# Patient Record
Sex: Male | Born: 1960 | Race: White | Hispanic: No | Marital: Married | Smoking: Never smoker
Health system: Southern US, Community
[De-identification: ages and names within clinical notes are randomized; demographics above are authoritative.]

## PROBLEM LIST (undated history)

## (undated) HISTORY — PX: CHOLECYSTECTOMY: SHX55

---

## 2021-01-22 ENCOUNTER — Emergency Department (HOSPITAL_BASED_OUTPATIENT_CLINIC_OR_DEPARTMENT_OTHER): Payer: 59

## 2021-01-22 ENCOUNTER — Observation Stay (HOSPITAL_BASED_OUTPATIENT_CLINIC_OR_DEPARTMENT_OTHER)
Admission: EM | Admit: 2021-01-22 | Discharge: 2021-01-24 | Disposition: A | Discharge status: Home / Self Care | Payer: 59 | Attending: Internal Medicine | Admitting: Internal Medicine

## 2021-01-22 ENCOUNTER — Encounter (HOSPITAL_BASED_OUTPATIENT_CLINIC_OR_DEPARTMENT_OTHER): Payer: Self-pay | Admitting: *Deleted

## 2021-01-22 ENCOUNTER — Other Ambulatory Visit: Payer: Self-pay

## 2021-01-22 DIAGNOSIS — Z20822 Contact with and (suspected) exposure to covid-19: Secondary | ICD-10-CM | POA: Insufficient documentation

## 2021-01-22 DIAGNOSIS — E785 Hyperlipidemia, unspecified: Secondary | ICD-10-CM | POA: Insufficient documentation

## 2021-01-22 DIAGNOSIS — R030 Elevated blood-pressure reading, without diagnosis of hypertension: Secondary | ICD-10-CM | POA: Diagnosis present

## 2021-01-22 DIAGNOSIS — N400 Enlarged prostate without lower urinary tract symptoms: Secondary | ICD-10-CM | POA: Diagnosis present

## 2021-01-22 DIAGNOSIS — R531 Weakness: Secondary | ICD-10-CM

## 2021-01-22 DIAGNOSIS — G459 Transient cerebral ischemic attack, unspecified: Secondary | ICD-10-CM | POA: Diagnosis not present

## 2021-01-22 DIAGNOSIS — N401 Enlarged prostate with lower urinary tract symptoms: Secondary | ICD-10-CM | POA: Diagnosis not present

## 2021-01-22 DIAGNOSIS — Z79899 Other long term (current) drug therapy: Secondary | ICD-10-CM | POA: Insufficient documentation

## 2021-01-22 LAB — URINALYSIS, ROUTINE W REFLEX MICROSCOPIC
Bilirubin Urine: NEGATIVE
Glucose, UA: NEGATIVE mg/dL
Hgb urine dipstick: NEGATIVE
Ketones, ur: NEGATIVE mg/dL
Leukocytes,Ua: NEGATIVE
Nitrite: NEGATIVE
Protein, ur: NEGATIVE mg/dL
Specific Gravity, Urine: 1.025 (ref 1.005–1.030)
pH: 7 (ref 5.0–8.0)

## 2021-01-22 LAB — COMPREHENSIVE METABOLIC PANEL
ALT: 18 U/L (ref 0–44)
AST: 19 U/L (ref 15–41)
Albumin: 3.8 g/dL (ref 3.5–5.0)
Alkaline Phosphatase: 80 U/L (ref 38–126)
Anion gap: 8 (ref 5–15)
BUN: 16 mg/dL (ref 6–20)
CO2: 26 mmol/L (ref 22–32)
Calcium: 8.6 mg/dL — ABNORMAL LOW (ref 8.9–10.3)
Chloride: 103 mmol/L (ref 98–111)
Creatinine, Ser: 1.1 mg/dL (ref 0.61–1.24)
GFR, Estimated: >60 mL/min (ref >60)
Glucose, Bld: 110 mg/dL — ABNORMAL HIGH (ref 70–99)
Potassium: 3.7 mmol/L (ref 3.5–5.1)
Sodium: 137 mmol/L (ref 135–145)
Total Bilirubin: 0.8 mg/dL (ref 0.3–1.2)
Total Protein: 6.8 g/dL (ref 6.5–8.1)

## 2021-01-22 LAB — RESP PANEL BY RT-PCR (FLU A&B, COVID) ARPGX2
Influenza A by PCR: NEGATIVE
Influenza B by PCR: NEGATIVE
SARS Coronavirus 2 by RT PCR: NEGATIVE

## 2021-01-22 LAB — PROTIME-INR
INR: 0.9 (ref 0.8–1.2)
Prothrombin Time: 12.2 seconds (ref 11.4–15.2)

## 2021-01-22 LAB — RAPID URINE DRUG SCREEN, HOSP PERFORMED
Amphetamines: NOT DETECTED
Barbiturates: NOT DETECTED
Benzodiazepines: NOT DETECTED
Cocaine: NOT DETECTED
Opiates: NOT DETECTED
Tetrahydrocannabinol: NOT DETECTED

## 2021-01-22 LAB — DIFFERENTIAL
Abs Immature Granulocytes: 0.04 10*3/uL (ref 0.00–0.07)
Basophils Absolute: 0.1 10*3/uL (ref 0.0–0.1)
Basophils Relative: 1 %
Eosinophils Absolute: 0.4 10*3/uL (ref 0.0–0.5)
Eosinophils Relative: 5 %
Immature Granulocytes: 1 %
Lymphocytes Relative: 36 %
Lymphs Abs: 2.7 10*3/uL (ref 0.7–4.0)
Monocytes Absolute: 0.8 10*3/uL (ref 0.1–1.0)
Monocytes Relative: 10 %
Neutro Abs: 3.6 10*3/uL (ref 1.7–7.7)
Neutrophils Relative %: 47 %

## 2021-01-22 LAB — CBC
HCT: 42.5 % (ref 39.0–52.0)
Hemoglobin: 15.4 g/dL (ref 13.0–17.0)
MCH: 31.1 pg (ref 26.0–34.0)
MCHC: 36.2 g/dL — ABNORMAL HIGH (ref 30.0–36.0)
MCV: 85.9 fL (ref 80.0–100.0)
Platelets: 209 10*3/uL (ref 150–400)
RBC: 4.95 MIL/uL (ref 4.22–5.81)
RDW: 12.3 % (ref 11.5–15.5)
WBC: 7.6 10*3/uL (ref 4.0–10.5)
nRBC: 0 % (ref 0.0–0.2)

## 2021-01-22 LAB — APTT: aPTT: 25 seconds (ref 24–36)

## 2021-01-22 LAB — CBG MONITORING, ED: Glucose-Capillary: 106 mg/dL — ABNORMAL HIGH (ref 70–99)

## 2021-01-22 LAB — ETHANOL: Alcohol, Ethyl (B): <10 mg/dL (ref <10)

## 2021-01-22 MED ORDER — ASPIRIN 81 MG PO CHEW
324.0000 mg | CHEWABLE_TABLET | Freq: Once | ORAL | Status: AC
  Administered 2021-01-22: 324 mg via ORAL
  Filled 2021-01-22: qty 4

## 2021-01-22 MED ORDER — IOHEXOL 350 MG/ML SOLN
80.0000 mL | Freq: Once | INTRAVENOUS | Status: AC | PRN
  Administered 2021-01-22: 80 mL via INTRAVENOUS

## 2021-01-22 MED ORDER — SODIUM CHLORIDE 0.9 % IV BOLUS
1000.0000 mL | Freq: Once | INTRAVENOUS | Status: AC
  Administered 2021-01-22: 1000 mL via INTRAVENOUS

## 2021-01-22 NOTE — Plan of Care (Signed)
[facility] will assume care on arrival to accepting facility. Until arrival, care as per EDP. However, [facility] available 24/7 for questions and assistance. ° °Nursing staff, please page [facility] Admits and Consults ([phone]) as soon as the patient arrives the hospital.   °

## 2021-01-22 NOTE — ED Notes (Signed)
Patient transported to CT 

## 2021-01-22 NOTE — ED Provider Notes (Signed)
[facility] EMERGENCY DEPARTMENT Provider Note   CSN: [id number] Arrival date & time: 01/22/21  1735     History  Chief Complaint  Patient presents with   Extremity Weakness    Seymore Brodowski is a 61 y.o. male.  HPI 61 year old male presents with acute right-sided weakness and tingling.  He states that at around 4:30 PM he was in [employer] and bent over to pick up something and when he stood up he developed lightheadedness, headache, right-sided tingling from his head down to his feet and right-sided weakness.  He is not sure about slurred speech or facial droop as he did not talk to anyone immediately.  For about 15 minutes his right arm was hard to move.  That has now resolved.  Tingling/numbness seems to be gone.  He feels like his right foot might be slightly less coordinated than the left but compared to earlier it is significantly better.  He had a hard time walking because it felt like his right leg would give out.  He states he has a PCP and has been dealing with prostate issues but otherwise denies cholesterol problems or hypertension or diabetes.  Home Medications Prior to Admission medications   Not on File      Allergies    Patient has no known allergies.    Review of Systems   Review of Systems  Neurological:  Positive for weakness, light-headedness, numbness and headaches. Negative for dizziness and speech difficulty.   Physical Exam Updated Vital Signs BP (!) 143/85 (BP Location: Left Arm)    Pulse 70    Temp 98.7 F (37.1 C) (Oral)    Resp 17    Ht 5' 11.5" (1.816 m)    Wt 92.5 kg    SpO2 99%    BMI 28.06 kg/m  Physical Exam Vitals and nursing note reviewed.  Constitutional:      Appearance: He is well-developed.  HENT:     Head: Normocephalic and atraumatic.  Eyes:     Extraocular Movements: Extraocular movements intact.     Pupils: Pupils are equal, round, and reactive to light.  Cardiovascular:     Rate and Rhythm: Normal rate and regular  rhythm.     Heart sounds: Normal heart sounds.  Pulmonary:     Effort: Pulmonary effort is normal.     Breath sounds: Normal breath sounds.  Abdominal:     General: There is no distension.     Palpations: Abdomen is soft.     Tenderness: There is no abdominal tenderness.  Skin:    General: Skin is warm and dry.  Neurological:     Mental Status: He is alert.     Comments: CN 3-12 grossly intact. 5/5 strength in all 4 extremities. Grossly normal sensation. Normal finger to nose. Normal gait    ED Results / Procedures / Treatments   Labs (all labs ordered are listed, but only abnormal results are displayed) Labs Reviewed  CBC - Abnormal; Notable for the following components:      Result Value   MCHC 36.2 (*)    All other components within normal limits  COMPREHENSIVE METABOLIC PANEL - Abnormal; Notable for the following components:   Glucose, Bld 110 (*)    Calcium 8.6 (*)    All other components within normal limits  CBG MONITORING, ED - Abnormal; Notable for the following components:   Glucose-Capillary 106 (*)    All other components within normal limits  RESP PANEL BY  RT-PCR (FLU A&B, COVID) ARPGX2  ETHANOL  PROTIME-INR  APTT  DIFFERENTIAL  RAPID URINE DRUG SCREEN, HOSP PERFORMED  URINALYSIS, ROUTINE W REFLEX MICROSCOPIC    EKG EKG Interpretation  Date/Time:  Thursday January 22 2021 17:49:05 EST Ventricular Rate:  85 PR Interval:  189 QRS Duration: 83 QT Interval:  373 QTC Calculation: 444 R Axis:   30 Text Interpretation: Sinus rhythm Atrial premature complex No old tracing to compare Confirmed by Pricilla Loveless [phone]) on 01/22/2021 7:12:46 PM  Radiology CT ANGIO HEAD NECK W WO CM  Result Date: 01/22/2021 CLINICAL DATA:  Transient ischemic attack, 15 minute episode of right-sided weakness and facial droop, all symptoms have resolved. EXAM: CT ANGIOGRAPHY HEAD AND NECK TECHNIQUE: Multidetector CT imaging of the head and neck was performed using the standard  protocol during bolus administration of intravenous contrast. Multiplanar CT image reconstructions and MIPs were obtained to evaluate the vascular anatomy. Carotid stenosis measurements (when applicable) are obtained utilizing NASCET criteria, using the distal internal carotid diameter as the denominator. RADIATION DOSE REDUCTION: This exam was performed according to the departmental dose-optimization program which includes automated exposure control, adjustment of the mA and/or kV according to patient size and/or use of iterative reconstruction technique. CONTRAST:  67mL OMNIPAQUE IOHEXOL 350 MG/ML SOLN COMPARISON:  No prior CTA, correlation is made with CT head 01/22/2021 FINDINGS: CT HEAD FINDINGS For noncontrast findings, please see same day CT head. CTA NECK FINDINGS Aortic arch: Standard branching. Imaged portion shows no evidence of aneurysm or dissection. No significant stenosis of the major arch vessel origins. Right carotid system: No evidence of dissection, stenosis (50% or greater) or occlusion. Left carotid system: No evidence of dissection, stenosis (50% or greater) or occlusion. Vertebral arteries: Right dominant. No evidence of dissection, stenosis (50% or greater) or occlusion. Skeleton: No acute osseous abnormality. Other neck: Negative. Upper chest: Negative. Review of the MIP images confirms the above findings CTA HEAD FINDINGS Anterior circulation: Both internal carotid arteries are patent to the termini, without stenosis or other abnormality. A1 segments patent. Normal anterior communicating artery. Anterior cerebral arteries are patent to their opacified distal aspects. No M1 stenosis or occlusion. Normal MCA bifurcations. Evaluation of the distal MCA branches is somewhat limited by bolus timing; however within this limitation, distal MCA branches appear perfused and symmetric. Posterior circulation: The left vertebral artery primarily supplies PICA, with a diminutive branch continuing to the  vertebrobasilar junction. The right vertebral artery is patent to the vertebrobasilar junction. Basilar patent to its distal aspect. Superior cerebellar arteries patent bilaterally. The left P1 originates from the basilar artery. Fetal origin of the right PCA. PCAs perfused to their distal aspects without stenosis. The left posterior communicating artery is not visualized. Venous sinuses: As permitted by contrast timing, patent. Anatomic variants: None significant Review of the MIP images confirms the above findings IMPRESSION: 1.  No intracranial large vessel occlusion or significant stenosis. 2.  No hemodynamically significant stenosis in the neck. Electronically Signed   By: Wiliam Ke M.D.   On: 01/22/2021 19:50   CT HEAD WO CONTRAST  Result Date: 01/22/2021 CLINICAL DATA:  Transient neuro deficit. EXAM: CT HEAD WITHOUT CONTRAST TECHNIQUE: Contiguous axial images were obtained from the base of the skull through the vertex without intravenous contrast. RADIATION DOSE REDUCTION: This exam was performed according to the departmental dose-optimization program which includes automated exposure control, adjustment of the mA and/or kV according to patient size and/or use of iterative reconstruction technique. COMPARISON:  None. FINDINGS: Brain: The  ventricles are normal in size and configuration. No extra-axial fluid collections are identified. The gray-white differentiation is maintained. No CT findings for acute hemispheric infarction or intracranial hemorrhage. No mass lesions. The brainstem and cerebellum are normal. Vascular: No hyperdense vessels or obvious aneurysm. Skull: No acute skull fracture.  No bone lesion. Sinuses/Orbits: The paranasal sinuses and mastoid air cells are clear. The globes are intact. Other: No scalp lesions, laceration or hematoma. IMPRESSION: No acute intracranial findings or mass lesions. Electronically Signed   By: Rudie MeyerP.  Gallerani M.D.   On: 01/22/2021 18:38     Procedures Procedures    Medications Ordered in ED Medications  aspirin chewable tablet 324 mg (324 mg Oral Given 01/22/21 1933)  sodium chloride 0.9 % bolus 1,000 mL (1,000 mLs Intravenous New Bag/Given 01/22/21 1933)  iohexol (OMNIPAQUE) 350 MG/ML injection 80 mL (80 mLs Intravenous Contrast Given 01/22/21 1916)    ED Course/ Medical Decision Making/ A&P Clinical Course as of 01/22/21 2014  Thu Jan 22, 2021  1754 Patient feels like his symptoms have essentially resolved.  There might be a small amount of residual right leg discoordination but he states its basically gone.  He was able to get up and walk.  He can stand on just his right leg when asked.  While he is within the code stroke window, his symptoms have so rapidly improved and are essentially gone but I do not think he needs thrombolytics and we will not call code stroke. [SG]    Clinical Course User Index [SG] Pricilla LovelessGoldston, Kaci Freel, MD                            Patient presents with an acute TIA.  His symptoms have now completely resolved.  Work-up included a head CT, which I personally reviewed and interpreted and there is no acute ischemia or bleeding.  Patient also has had lab work that I have reviewed and interpreted and shows no leukocytosis, anemia, or significant electrolyte disturbance to cause his symptoms.  He has a mild hypocalcemia that I think is unlikely to contribute.  ECG was obtained and has been personally interpreted and shows no obvious arrhythmia or ischemia.  I discussed his case with neurology on-call, Dr. Selina CooleyStack, who recommends admission for TIA work-up.  Asks for CTA of head and neck, which is negative.  We will give 325 mg aspirin per their recommendations.  Neurology will follow.  Thus [facility]  I discussed this case with the hospitalist, Dr. Loney Lohathore, and she will admit.  Chart review does not show much in the way of significant past medical history or relevant visits  compared to today.  I have updated patient and son at the bedside as well as the wife over the phone.        Final Clinical Impression(s) / ED Diagnoses Final diagnoses:  TIA (transient ischemic attack)    Rx / DC Orders ED Discharge Orders     None         Pricilla LovelessGoldston, Verlene Glantz, MD 01/22/21 2017

## 2021-01-22 NOTE — ED Triage Notes (Signed)
An hour ago he bent over. When he stood he lost feeling in the right side of his body. Facial droop.

## 2021-01-23 DIAGNOSIS — N401 Enlarged prostate with lower urinary tract symptoms: Secondary | ICD-10-CM | POA: Diagnosis not present

## 2021-01-23 DIAGNOSIS — R030 Elevated blood-pressure reading, without diagnosis of hypertension: Secondary | ICD-10-CM | POA: Diagnosis not present

## 2021-01-23 DIAGNOSIS — Z79899 Other long term (current) drug therapy: Secondary | ICD-10-CM | POA: Diagnosis not present

## 2021-01-23 DIAGNOSIS — G459 Transient cerebral ischemic attack, unspecified: Secondary | ICD-10-CM | POA: Diagnosis not present

## 2021-01-23 DIAGNOSIS — N400 Enlarged prostate without lower urinary tract symptoms: Secondary | ICD-10-CM | POA: Diagnosis present

## 2021-01-23 DIAGNOSIS — R531 Weakness: Secondary | ICD-10-CM

## 2021-01-23 DIAGNOSIS — E785 Hyperlipidemia, unspecified: Secondary | ICD-10-CM | POA: Diagnosis not present

## 2021-01-23 DIAGNOSIS — Z20822 Contact with and (suspected) exposure to covid-19: Secondary | ICD-10-CM | POA: Diagnosis not present

## 2021-01-23 LAB — HIV ANTIBODY (ROUTINE TESTING W REFLEX): HIV Screen 4th Generation wRfx: NONREACTIVE

## 2021-01-23 LAB — LIPID PANEL
Cholesterol: 139 mg/dL (ref 0–200)
HDL: 48 mg/dL (ref >40)
LDL Cholesterol: 82 mg/dL (ref 0–99)
Total CHOL/HDL Ratio: 2.9 RATIO
Triglycerides: 44 mg/dL (ref <150)
VLDL: 9 mg/dL (ref 0–40)

## 2021-01-23 MED ORDER — PANTOPRAZOLE SODIUM 40 MG PO TBEC
40.0000 mg | DELAYED_RELEASE_TABLET | Freq: Every day | ORAL | Status: DC
  Administered 2021-01-23 – 2021-01-24 (×2): 40 mg via ORAL
  Filled 2021-01-23 (×2): qty 1

## 2021-01-23 MED ORDER — ACETAMINOPHEN 325 MG PO TABS
650.0000 mg | ORAL_TABLET | ORAL | Status: DC | PRN
  Administered 2021-01-24: 650 mg via ORAL
  Filled 2021-01-23: qty 2

## 2021-01-23 MED ORDER — ASPIRIN EC 81 MG PO TBEC
81.0000 mg | DELAYED_RELEASE_TABLET | Freq: Every day | ORAL | Status: DC
  Administered 2021-01-23 – 2021-01-24 (×2): 81 mg via ORAL
  Filled 2021-01-23 (×2): qty 1

## 2021-01-23 MED ORDER — ENOXAPARIN SODIUM 40 MG/0.4ML IJ SOSY
40.0000 mg | PREFILLED_SYRINGE | INTRAMUSCULAR | Status: DC
  Administered 2021-01-23: 40 mg via SUBCUTANEOUS
  Filled 2021-01-23: qty 0.4

## 2021-01-23 MED ORDER — TAMSULOSIN HCL 0.4 MG PO CAPS
0.4000 mg | ORAL_CAPSULE | Freq: Every day | ORAL | Status: DC
  Administered 2021-01-23: 0.4 mg via ORAL
  Filled 2021-01-23: qty 1

## 2021-01-23 MED ORDER — STROKE: EARLY STAGES OF RECOVERY BOOK
Freq: Once | Status: AC
  Filled 2021-01-23: qty 1

## 2021-01-23 MED ORDER — SENNOSIDES-DOCUSATE SODIUM 8.6-50 MG PO TABS: 1.0000 | ORAL_TABLET | Freq: Every evening | ORAL | Status: DC | PRN

## 2021-01-23 MED ORDER — ACETAMINOPHEN 650 MG RE SUPP: 650.0000 mg | RECTAL | Status: DC | PRN

## 2021-01-23 MED ORDER — ACETAMINOPHEN 160 MG/5ML PO SOLN: 650.0000 mg | ORAL | Status: DC | PRN

## 2021-01-23 MED ORDER — OMEPRAZOLE MAGNESIUM 20 MG PO TBEC: 20.0000 mg | DELAYED_RELEASE_TABLET | Freq: Every day | ORAL | Status: DC

## 2021-01-23 NOTE — ED Notes (Signed)
Phone Handoff Report provided to Sarah-RN at [facility] [address]

## 2021-01-23 NOTE — ED Notes (Signed)
Resting quietly, voices no complaints, ambulates self to restroom. Cont to await for room assignment.

## 2021-01-23 NOTE — Assessment & Plan Note (Signed)
Continue flomax  

## 2021-01-23 NOTE — ED Notes (Signed)
Phone Handoff Report provided to [employer] Jerilynn Som)

## 2021-01-23 NOTE — Evaluation (Signed)
Physical Therapy Evaluation & Discharge Patient Details Name: Austin Harrison MRN: [id number] DOB: September 04, 1960 Today's Date: 01/23/2021  History of Present Illness  61 y/o male presented to [facility] ED on 01/22/21 for acute R sided weakness and tingling. CT head negative. MRI pending. No significant PMH.  Clinical Impression  Patient admitted with above. Patient functioning at independent level. Obtained orthostatics due to patient having complaints of dizziness/headache when getting out of bed, however vitals were stable. Patient scored 24/24 on DGI which is indicative of low fall risk. No further skilled PT needs identified. No PT follow up recommended at this time. PT will sign off.        Recommendations for follow up therapy are one component of a multi-disciplinary discharge planning process, led by the attending physician.  Recommendations may be updated based on patient status, additional functional criteria and insurance authorization.  Follow Up Recommendations No PT follow up    Assistance Recommended at Discharge None  Patient can return home with the following       Equipment Recommendations None recommended by PT  Recommendations for Other Services       Functional Status Assessment Patient has not had a recent decline in their functional status     Precautions / Restrictions Precautions Precautions: None Restrictions Weight Bearing Restrictions: No      Mobility  Bed Mobility Overal bed mobility: Independent                  Transfers Overall transfer level: Independent                      Ambulation/Gait Ambulation/Gait assistance: Independent Gait Distance (Feet): 250 Feet Assistive device: None Gait Pattern/deviations: WFL(Within Functional Limits)   Gait velocity interpretation: >4.37 ft/sec, indicative of normal walking speed      Stairs Stairs: Yes Stairs assistance: Independent Stair Management: No rails, Alternating  pattern Number of Stairs: 2    Wheelchair Mobility    Modified Rankin (Stroke Patients Only)       Balance Overall balance assessment: Independent                               Standardized Balance Assessment Standardized Balance Assessment : Dynamic Gait Index   Dynamic Gait Index Level Surface: Normal Change in Gait Speed: Normal Gait with Horizontal Head Turns: Normal Gait with Vertical Head Turns: Normal Gait and Pivot Turn: Normal Step Over Obstacle: Normal Step Around Obstacles: Normal Steps: Normal Total Score: 24       Pertinent Vitals/Pain Pain Assessment Pain Assessment: No/denies pain    Home Living Family/patient expects to be discharged to:: Private residence Living Arrangements: Spouse/significant other;Children (has 44 year old and 10.34 year old living at home. Has 53 y/o son) Available Help at Discharge: Family Type of Home: House Home Access: Level entry       Home Layout: One level Home Equipment: None      Prior Function Prior Level of Function : Independent/Modified Independent;Working/employed;Driving                     Hand Dominance        Extremity/Trunk Assessment   Upper Extremity Assessment Upper Extremity Assessment: Overall WFL for tasks assessed    Lower Extremity Assessment Lower Extremity Assessment: Overall WFL for tasks assessed    Cervical / Trunk Assessment Cervical / Trunk Assessment: Normal  Communication  Communication: No difficulties  Cognition Arousal/Alertness: Awake/alert Behavior During Therapy: WFL for tasks assessed/performed Overall Cognitive Status: Within Functional Limits for tasks assessed                                          General Comments      Exercises     Assessment/Plan    PT Assessment Patient does not need any further PT services  PT Problem List         PT Treatment Interventions      PT Goals (Current goals can be found in  the Care Plan section)  Acute Rehab PT Goals Patient Stated Goal: to go home PT Goal Formulation: All assessment and education complete, DC therapy    Frequency       Co-evaluation               AM-PAC PT "6 Clicks" Mobility  Outcome Measure Help needed turning from your back to your side while in a flat bed without using bedrails?: None Help needed moving from lying on your back to sitting on the side of a flat bed without using bedrails?: None Help needed moving to and from a bed to a chair (including a wheelchair)?: None Help needed standing up from a chair using your arms (e.g., wheelchair or bedside chair)?: None Help needed to walk in hospital room?: None Help needed climbing 3-5 steps with a railing? : None 6 Click Score: 24    End of Session   Activity Tolerance: Patient tolerated treatment well Patient left: in bed;with call bell/phone within reach Nurse Communication: Mobility status PT Visit Diagnosis: Muscle weakness (generalized) (M62.81)    Time: 9371-6967 PT Time Calculation (min) (ACUTE ONLY): 20 min   Charges:   PT Evaluation $PT Eval Low Complexity: 1 Low          Aadhav Uhlig A. Dan Humphreys PT, DPT Acute Rehabilitation Services Pager [phone] Office [phone]   Viviann Spare 01/23/2021, 5:20 PM

## 2021-01-23 NOTE — Assessment & Plan Note (Addendum)
61 year old male with sudden onset right sided weakness and tingling. Risks factors for stroke include age, sex, possible HTN.  -place in observation on telemetry for TIA/stroke work-up. Symptoms have resolved.  -Neurochecks per protocol -Neurology consulted -MRI brain without contrast ordered -echo -lipid/a1c -started on ASA daily, plavix per neurology  -Permissive hypertension first 24 hours <220/110 -PT/OT, speech wnl with no cognitive deficits

## 2021-01-23 NOTE — Assessment & Plan Note (Signed)
He states he has no history of elevated blood pressure. Has periods of what sound like orthostasis where he gets dizzy after bending over Allow for permissive HTN for now in setting of possible stroke Continue to monitor

## 2021-01-23 NOTE — Plan of Care (Signed)

## 2021-01-23 NOTE — Progress Notes (Signed)
°  Transition of Care [facility]) Screening Note   Patient Details  Name: Austin Harrison Date of Birth: Mar 22, 1960   Transition of [facility]) CM/SW Contact:    Pollie Friar, RN Phone Number: 01/23/2021, 3:14 PM    Transition of Care Department [facility]) has reviewed patient. We will continue to monitor patient advancement through interdisciplinary progression rounds. If new patient transition needs arise, please place a TOC consult.

## 2021-01-23 NOTE — H&P (Signed)
History and Physical    Austin Harrison ZHG:[id number] DOB: 07-13-1960 DOA: 01/22/2021  PCP: Doreen Salvage, PA-C Consultants:  urology: unsure of name  Patient coming from:  [facility]. Lives at home with his wife   Chief Complaint: right sided weakness, warmth and tingling  HPI: Austin Harrison is a 61 y.o. male with medical history significant of BPH who presented to ED for right sided weakness, tingling and warmth. LKW on 01/22/21 at around 4:00pm. Around 4:30 pm he was  at [employer] and he bent down to pick something up and as he stood up he had warmth and tingling that started on the right side of his head and went down the entire right side of his body. He has subsequent right sided weakness and felt like he would fall if he tried to walk. He also had tingling and had a feeling like his right arm was just hanging from his body. Tingling lasted for 5 minutes. Weakness lasted for about 1 hour and 15 minutes. He was also told by edp that his right sided of his face may have had a slight droop. Symptoms have resolved.   Risk factors for stroke: age, sex. He does not smoke. No family hx of strokes. He does not have known diabetes and has not been told he has HTN or HLD.   He overall has felt okay. He has had no fever/chills, does have a slight headache, no vision changes, no chest pain or palpitations, no shortness or cough, no abdominal pain, N/V/D, no dysuria or leg swelling.    ED Course: vitals: afebrile, bp: 166/91, HR: 79, RR: 20, oxygen: 100% RA Pertinent labs: none CTH: no acute findings CTA head/neck: no acute findings In ED: given ASA, 1L bolus and neurology was consulted who recommended transfer to [facility] for Tia/stroke w/u. [facility] was asked to admit.    Review of Systems: As per HPI; otherwise review of systems reviewed and negative.   Ambulatory Status:  Ambulates without assistance   History reviewed. No pertinent past medical history.  Past Surgical History:  Procedure Laterality Date    CHOLECYSTECTOMY      Social History   Socioeconomic History   Marital status: Married    Spouse name: Not on file   Number of children: Not on file   Years of education: Not on file   Highest education level: Not on file  Occupational History   Not on file  Tobacco Use   Smoking status: Never   Smokeless tobacco: Never  Vaping Use   Vaping Use: Never used  Substance and Sexual Activity   Alcohol use: Never   Drug use: Never   Sexual activity: Not on file  Other Topics Concern   Not on file  Social History Narrative   Not on file   Social Determinants of Health   Financial Resource Strain: Not on file  Food Insecurity: Not on file  Transportation Needs: Not on file  Physical Activity: Not on file  Stress: Not on file  Social Connections: Not on file  Intimate Partner Violence: Not on file    Allergies  Allergen Reactions   Codeine Nausea And Vomiting         No family history on file.  Prior to Admission medications   Medication Sig Start Date End Date Taking? Authorizing Provider  omeprazole (PRILOSEC OTC) 20 MG tablet Take 20 mg by mouth daily.   Yes [provider]  tamsulosin (FLOMAX) 0.4 MG CAPS capsule Take 0.4  mg by mouth at bedtime. 10/28/20  Yes [provider]    Physical Exam: Vitals:   01/23/21 1100 01/23/21 1216 01/23/21 1219 01/23/21 1550  BP: (!) 141/87 (!) 158/92 (!) 168/92 129/78  Pulse: 65 76 72 76  Resp: 19 18  18   Temp:  98 F (36.7 C)  98.1 F (36.7 C)  TempSrc:  Oral  Oral  SpO2: 96% 99%  98%  Weight:      Height:         General:  Appears calm and comfortable and is in NAD Eyes:  PERRL, EOMI, normal lids, iris ENT:  grossly normal hearing, lips & tongue, mmm; appropriate dentition Neck:  no LAD, masses or thyromegaly; no carotid bruits Cardiovascular:  RRR, no m/r/g. No LE edema.  Respiratory:   CTA bilaterally with no wheezes/rales/rhonchi.  Normal respiratory effort. Abdomen:  soft, NT, ND,  NABS Back:   normal alignment, no CVAT Skin:  no rash or induration seen on limited exam Musculoskeletal:  grossly normal tone BUE/BLE, good ROM, no bony abnormality Lower extremity:  No LE edema.  Limited foot exam with no ulcerations.  2+ distal pulses. Psychiatric:  grossly normal mood and affect, speech fluent and appropriate, AOx3 Neurologic:  CN 2-12 grossly intact, moves all extremities in coordinated fashion, sensation intact. No pronator drift, HTK intact bilaterally, FTK intact bilaterally, DTR 2+, gait deferred.     Radiological Exams on Admission: Independently reviewed - see discussion in A/P where applicable  CT ANGIO HEAD NECK W WO CM  Result Date: 01/22/2021 CLINICAL DATA:  Transient ischemic attack, 15 minute episode of right-sided weakness and facial droop, all symptoms have resolved. EXAM: CT ANGIOGRAPHY HEAD AND NECK TECHNIQUE: Multidetector CT imaging of the head and neck was performed using the standard protocol during bolus administration of intravenous contrast. Multiplanar CT image reconstructions and MIPs were obtained to evaluate the vascular anatomy. Carotid stenosis measurements (when applicable) are obtained utilizing NASCET criteria, using the distal internal carotid diameter as the denominator. RADIATION DOSE REDUCTION: This exam was performed according to the departmental dose-optimization program which includes automated exposure control, adjustment of the mA and/or kV according to patient size and/or use of iterative reconstruction technique. CONTRAST:  30mL OMNIPAQUE IOHEXOL 350 MG/ML SOLN COMPARISON:  No prior CTA, correlation is made with CT head 01/22/2021 FINDINGS: CT HEAD FINDINGS For noncontrast findings, please see same day CT head. CTA NECK FINDINGS Aortic arch: Standard branching. Imaged portion shows no evidence of aneurysm or dissection. No significant stenosis of the major arch vessel origins. Right carotid system: No evidence of dissection, stenosis  (50% or greater) or occlusion. Left carotid system: No evidence of dissection, stenosis (50% or greater) or occlusion. Vertebral arteries: Right dominant. No evidence of dissection, stenosis (50% or greater) or occlusion. Skeleton: No acute osseous abnormality. Other neck: Negative. Upper chest: Negative. Review of the MIP images confirms the above findings CTA HEAD FINDINGS Anterior circulation: Both internal carotid arteries are patent to the termini, without stenosis or other abnormality. A1 segments patent. Normal anterior communicating artery. Anterior cerebral arteries are patent to their opacified distal aspects. No M1 stenosis or occlusion. Normal MCA bifurcations. Evaluation of the distal MCA branches is somewhat limited by bolus timing; however within this limitation, distal MCA branches appear perfused and symmetric. Posterior circulation: The left vertebral artery primarily supplies PICA, with a diminutive branch continuing to the vertebrobasilar junction. The right vertebral artery is patent to the vertebrobasilar junction. Basilar patent to its distal aspect. Superior  cerebellar arteries patent bilaterally. The left P1 originates from the basilar artery. Fetal origin of the right PCA. PCAs perfused to their distal aspects without stenosis. The left posterior communicating artery is not visualized. Venous sinuses: As permitted by contrast timing, patent. Anatomic variants: None significant Review of the MIP images confirms the above findings IMPRESSION: 1.  No intracranial large vessel occlusion or significant stenosis. 2.  No hemodynamically significant stenosis in the neck. Electronically Signed   By: Merilyn Baba M.D.   On: 01/22/2021 19:50   CT HEAD WO CONTRAST  Result Date: 01/22/2021 CLINICAL DATA:  Transient neuro deficit. EXAM: CT HEAD WITHOUT CONTRAST TECHNIQUE: Contiguous axial images were obtained from the base of the skull through the vertex without intravenous contrast. RADIATION DOSE  REDUCTION: This exam was performed according to the departmental dose-optimization program which includes automated exposure control, adjustment of the mA and/or kV according to patient size and/or use of iterative reconstruction technique. COMPARISON:  None. FINDINGS: Brain: The ventricles are normal in size and configuration. No extra-axial fluid collections are identified. The gray-white differentiation is maintained. No CT findings for acute hemispheric infarction or intracranial hemorrhage. No mass lesions. The brainstem and cerebellum are normal. Vascular: No hyperdense vessels or obvious aneurysm. Skull: No acute skull fracture.  No bone lesion. Sinuses/Orbits: The paranasal sinuses and mastoid air cells are clear. The globes are intact. Other: No scalp lesions, laceration or hematoma. IMPRESSION: No acute intracranial findings or mass lesions. Electronically Signed   By: Marijo Sanes M.D.   On: 01/22/2021 18:38    EKG: Independently reviewed.  NSR with rate 85; nonspecific ST changes with no evidence of acute ischemia   Labs on Admission: I have personally reviewed the available labs and imaging studies at the time of the admission.  Pertinent labs:  None     Assessment/Plan * Acute right-sided weakness 61 year old male with sudden onset right sided weakness and tingling. Risks factors for stroke include age, sex, possible HTN.  -place in observation on telemetry for TIA/stroke work-up. Symptoms have resolved.  -Neurochecks per protocol -Neurology consulted -MRI brain without contrast ordered -echo -lipid/a1c -started on ASA daily, plavix per neurology  -Permissive hypertension first 24 hours <220/110 -PT/OT, speech wnl with no cognitive deficits   Elevated blood-pressure reading without diagnosis of hypertension- (present on admission) He states he has no history of elevated blood pressure. Has periods of what sound like orthostasis where he gets dizzy after bending over Allow  for permissive HTN for now in setting of possible stroke Continue to monitor   BPH (benign prostatic hyperplasia)- (present on admission) Continue flomax      Body mass index is 28.06 kg/m.   Level of care: Telemetry Medical DVT prophylaxis:  Lovenox  Code Status:  Full - confirmed with patient Family Communication: None present Disposition Plan:  The patient is from: home  Anticipated d/c is to: home   Patient placed in observation as anticipate less than 2 midnight stay. Requires hospitalization for stroke/tia work up, frequent neurological checks, assessment and MDM with specialists.    Patient is currently: stable Consults called: neurology: Dr. Lorrin Goodell   Admission status:  observation    Orma Flaming MD [facility]   How to contact the [facility] Attending or Consulting provider [place] or covering provider during after hours [place], for this patient?  Check the care team in [facility] and look for a) attending/consulting [facility] provider listed and b) the [facility] team listed Log into [email or web address]  and use [place] universal password to access. If you do not have the password, please contact the hospital operator. Locate the [facility] provider you are looking for under [facility] and page to a number that you can be directly reached. If you still have difficulty reaching the provider, please page the [facility] (Director on Call) for the Hospitalists listed on amion for assistance.   01/23/2021, 7:23 PM

## 2021-01-24 ENCOUNTER — Observation Stay (HOSPITAL_BASED_OUTPATIENT_CLINIC_OR_DEPARTMENT_OTHER): Payer: 59

## 2021-01-24 ENCOUNTER — Observation Stay (HOSPITAL_COMMUNITY): Payer: 59

## 2021-01-24 DIAGNOSIS — G459 Transient cerebral ischemic attack, unspecified: Secondary | ICD-10-CM

## 2021-01-24 DIAGNOSIS — E7800 Pure hypercholesterolemia, unspecified: Secondary | ICD-10-CM

## 2021-01-24 DIAGNOSIS — R531 Weakness: Secondary | ICD-10-CM | POA: Diagnosis not present

## 2021-01-24 LAB — ECHOCARDIOGRAM COMPLETE
AV Mean grad: 6 mmHg
AV Peak grad: 11.2 mmHg
Ao pk vel: 1.67 m/s
Area-P 1/2: 3.08 cm2
Calc EF: 58.1 %
Height: 71.5 in
S' Lateral: 2.6 cm
Single Plane A2C EF: 60.8 %
Single Plane A4C EF: 55.5 %
Weight: 3264 oz

## 2021-01-24 LAB — HEMOGLOBIN A1C
Hgb A1c MFr Bld: 5.6 % (ref 4.8–5.6)
Mean Plasma Glucose: 114 mg/dL

## 2021-01-24 MED ORDER — CLOPIDOGREL BISULFATE 75 MG PO TABS: 75.0000 mg | ORAL_TABLET | Freq: Every day | ORAL | 0 refills | Status: AC

## 2021-01-24 MED ORDER — ATORVASTATIN CALCIUM 40 MG PO TABS: 40.0000 mg | ORAL_TABLET | Freq: Every day | ORAL | 2 refills | Status: AC

## 2021-01-24 MED ORDER — ASPIRIN 81 MG PO TBEC: 81.0000 mg | DELAYED_RELEASE_TABLET | Freq: Every day | ORAL | 11 refills | Status: AC

## 2021-01-24 MED ORDER — ATORVASTATIN CALCIUM 40 MG PO TABS
40.0000 mg | ORAL_TABLET | Freq: Every day | ORAL | Status: DC
  Administered 2021-01-24: 40 mg via ORAL
  Filled 2021-01-24: qty 1

## 2021-01-24 MED ORDER — CLOPIDOGREL BISULFATE 75 MG PO TABS: 75.0000 mg | ORAL_TABLET | Freq: Every day | ORAL | Status: DC

## 2021-01-24 MED ORDER — CLOPIDOGREL BISULFATE 75 MG PO TABS
300.0000 mg | ORAL_TABLET | Freq: Once | ORAL | Status: AC
  Administered 2021-01-24: 300 mg via ORAL
  Filled 2021-01-24: qty 4

## 2021-01-24 NOTE — Consult Note (Signed)
Neurology Consultation Reason for Consult: TIA Referring Physician: Eliberto Ivory, a  CC: Right-sided paresthesia and weakness  History is obtained from: Patient  HPI: Austin Harrison is a 61 y.o. male who was in his normal state of health until around 4 PM on 1/19.  He states that he had numbness that spread down the entire right side of his body.  He noticed that his right arm was limp and not moving correctly when he tried to move it.  He was not able to walk on that leg.  The weakness then gradually improved over the course of an hour or two.  He denies any visual change.  He denies any previous similar symptoms.   LKW: 4 PM 1/19 tpa given?: no, resolution of symptoms   ROS: A 14 point ROS was performed and is negative except as noted in the HPI.    History reviewed. No pertinent past medical history.   No family history on file.   Social History:  reports that he has never smoked. He has never used smokeless tobacco. He reports that he does not drink alcohol and does not use drugs.   Exam: Current vital signs: BP 129/78 (BP Location: Left Arm)    Pulse 76    Temp 98.1 F (36.7 C) (Oral)    Resp 18    Ht 5' 11.5" (1.816 m)    Wt 92.5 kg    SpO2 98%    BMI 28.06 kg/m  Vital signs in last 24 hours: Temp:  [98 F (36.7 C)-98.1 F (36.7 C)] 98.1 F (36.7 C) (01/20 1550) Pulse Rate:  [61-76] 76 (01/20 1550) Resp:  [16-22] 18 (01/20 1550) BP: (129-168)/(78-92) 129/78 (01/20 1550) SpO2:  [96 %-99 %] 98 % (01/20 1550)   Physical Exam  Constitutional: Appears well-developed and well-nourished.  Psych: Affect appropriate to situation Eyes: No scleral injection HENT: No OP obstruction MSK: no joint deformities.  Cardiovascular: Normal rate and regular rhythm.  Respiratory: Effort normal, non-labored breathing GI: Soft.  No distension. There is no tenderness.  Skin: WDI  Neuro: Mental Status: Patient is awake, alert, oriented to person, place, month, year, and situation. Patient  is able to give a clear and coherent history. No signs of aphasia or neglect Cranial Nerves: II: Visual Fields are full. Pupils are equal, round, and reactive to light.   III,IV, VI: EOMI without ptosis or diploplia.  V: Facial sensation is symmetric to temperature VII: Facial movement is symmetric at rest, but when he smiles the right side of his face comes up higher than the left, I am uncertain if this is chronic VIII: hearing is intact to voice X: Uvula elevates symmetrically XI: Shoulder shrug is symmetric. XII: tongue is midline without atrophy or fasciculations.  Motor: Tone is normal. Bulk is normal. 5/5 strength was present in all four extremities.  Sensory: Sensation is symmetric to light touch and temperature in the arms and legs. Cerebellar: FNF and HKS are intact bilaterally   I have reviewed labs in epic and the results pertinent to this consultation are: CMP - unremarkable  I have reviewed the images obtained: CT/CTA-negative  Impression: 62 year old male with transient right-sided weakness and numbness.  I suspect that this represents transient ischemic attack.  He has been admitted for further work-up.  Recommendations: - HgbA1c, fasting lipid panel - MRI of the brain without contrast - Frequent neuro checks - Echocardiogram - Prophylactic therapy-Antiplatelet med: Aspirin - dose 81mg  and plavix 75mg  daily  after 300mg  load  -  Risk factor modification - Telemetry monitoring - Stroke team to follow   Roland Rack, MD [facility] [phone]  If 7pm- 7am, please page neurology on call as listed in [place].

## 2021-01-24 NOTE — Discharge Summary (Signed)
[facility]  Physician Discharge Summary   Patient ID: Austin Harrison MRN: [id number] DOB/AGE: 08/15/1960 61 y.o.  Admit date: 01/22/2021 Discharge date: 01/24/2021    PCP: Egbert Garibaldi, PA-C  DISCHARGE DIAGNOSES:  TIA Hyperlipidemia History of BPH   RECOMMENDATIONS FOR OUTPATIENT FOLLOW UP: Follow-up with primary care provider in 1 week   Home Health: None Equipment/Devices: None  CODE STATUS: Full code  DISCHARGE CONDITION: fair  Diet recommendation: Heart healthy  INITIAL HISTORY: As per H&P: "Austin Harrison is a 61 y.o. male with medical history significant of BPH who presented to ED for right sided weakness, tingling and warmth. LKW on 01/22/21 at around 4:00pm. Around 4:30 pm he was  at [employer] and he bent down to pick something up and as he stood up he had warmth and tingling that started on the right side of his head and went down the entire right side of his body. He has subsequent right sided weakness and felt like he would fall if he tried to walk. He also had tingling and had a feeling like his right arm was just hanging from his body. Tingling lasted for 5 minutes. Weakness lasted for about 1 hour and 15 minutes. He was also told by edp that his right sided of his face may have had a slight droop. Symptoms have resolved.    Risk factors for stroke: age, sex. He does not smoke. No family hx of strokes. He does not have known diabetes and has not been told he has HTN or HLD.    He overall has felt okay. He has had no fever/chills, does have a slight headache, no vision changes, no chest pain or palpitations, no shortness or cough, no abdominal pain, N/V/D, no dysuria or leg swelling."    Consultations: Neurology  Procedures: Transthoracic echocardiogram   HOSPITAL COURSE:   Patient presented with acute right-sided weakness.  Symptoms quickly resolved.  MRI brain was negative for stroke.  Concern was that patient had TIA.  LDL 82.  CT angiogram head  and neck was unremarkable.  Transthoracic echocardiogram showed normal systolic function.  Patient started on statin.  Started on aspirin and Plavix for 3 weeks followed by aspirin alone per neurology recommendations.  Initially he had elevated blood pressure at presentation.  Permissive hypertension was initially allowed.  Blood pressure have normalized.  Will not be discharged on any antihypertensives.  He should follow-up with his primary care provider and with neurology.  Ambulatory referral has been sent to neurology office.  Patient seen by PT OT and noted to be independent.  Does not need any therapy ongoing.  Discussed with neurology.  Patient is stable.  Okay for discharge home today.   PERTINENT LABS:  The results of significant diagnostics from this hospitalization (including imaging, microbiology, ancillary and laboratory) are listed below for reference.    Microbiology: Recent Results (from the past 240 hour(s))  Resp Panel by RT-PCR (Flu A&B, Covid) Nasopharyngeal Swab     Status: None   Collection Time: 01/22/21  5:58 PM   Specimen: Nasopharyngeal Swab; Nasopharyngeal(NP) swabs in vial transport medium  Result Value Ref Range Status   SARS Coronavirus 2 by RT PCR NEGATIVE NEGATIVE Final    Comment: (NOTE) SARS-CoV-2 target nucleic acids are NOT DETECTED.  The SARS-CoV-2 RNA is generally detectable in upper respiratory specimens during the acute phase of infection. The lowest concentration of SARS-CoV-2 viral copies this assay can detect is 138 copies/mL. A negative result does not preclude SARS-Cov-2  infection and should not be used as the sole basis for treatment or other patient management decisions. A negative result may occur with  improper specimen collection/handling, submission of specimen other than nasopharyngeal swab, presence of viral mutation(s) within the areas targeted by this assay, and inadequate number of viral copies(<138 copies/mL). A negative result must  be combined with clinical observations, patient history, and epidemiological information. The expected result is Negative.  Fact Sheet for Patients:  EntrepreneurPulse.com.au  Fact Sheet for Healthcare Providers:  IncredibleEmployment.be  This test is no t yet approved or cleared by the [place] FDA and  has been authorized for detection and/or diagnosis of SARS-CoV-2 by FDA under an Emergency Use Authorization (EUA). This EUA will remain  in effect (meaning this test can be used) for the duration of the COVID-19 declaration under Section 564(b)(1) of the Act, 21 U.S.C.section 360bbb-3(b)(1), unless the authorization is terminated  or revoked sooner.       Influenza A by PCR NEGATIVE NEGATIVE Final   Influenza B by PCR NEGATIVE NEGATIVE Final    Comment: (NOTE) The Xpert Xpress SARS-CoV-2/FLU/RSV plus assay is intended as an aid in the diagnosis of influenza from Nasopharyngeal swab specimens and should not be used as a sole basis for treatment. Nasal washings and aspirates are unacceptable for Xpert Xpress SARS-CoV-2/FLU/RSV testing.  Fact Sheet for Patients: EntrepreneurPulse.com.au  Fact Sheet for Healthcare Providers: IncredibleEmployment.be  This test is not yet approved or cleared by the [place] FDA and has been authorized for detection and/or diagnosis of SARS-CoV-2 by FDA under an Emergency Use Authorization (EUA). This EUA will remain in effect (meaning this test can be used) for the duration of the COVID-19 declaration under Section 564(b)(1) of the Act, 21 U.S.C. section 360bbb-3(b)(1), unless the authorization is terminated or revoked.  Performed at [facility], [address] [place], [place] [address]      Labs:  COVID-19 Labs   Lab Results  Component Value Date   [place] NEGATIVE 01/22/2021      Basic Metabolic Panel: Recent Labs  Lab  01/22/21 1758  NA 137  K 3.7  CL 103  CO2 26  GLUCOSE 110*  BUN 16  CREATININE 1.10  CALCIUM 8.6*   Liver Function Tests: Recent Labs  Lab 01/22/21 1758  AST 19  ALT 18  ALKPHOS 80  BILITOT 0.8  PROT 6.8  ALBUMIN 3.8    CBC: Recent Labs  Lab 01/22/21 1758  WBC 7.6  NEUTROABS 3.6  HGB 15.4  HCT 42.5  MCV 85.9  PLT 209    CBG: Recent Labs  Lab 01/22/21 1756  GLUCAP 106*     IMAGING STUDIES CT ANGIO HEAD NECK W WO CM  Result Date: 01/22/2021 CLINICAL DATA:  Transient ischemic attack, 15 minute episode of right-sided weakness and facial droop, all symptoms have resolved. EXAM: CT ANGIOGRAPHY HEAD AND NECK TECHNIQUE: Multidetector CT imaging of the head and neck was performed using the standard protocol during bolus administration of intravenous contrast. Multiplanar CT image reconstructions and MIPs were obtained to evaluate the vascular anatomy. Carotid stenosis measurements (when applicable) are obtained utilizing NASCET criteria, using the distal internal carotid diameter as the denominator. RADIATION DOSE REDUCTION: This exam was performed according to the departmental dose-optimization program which includes automated exposure control, adjustment of the mA and/or kV according to patient size and/or use of iterative reconstruction technique. CONTRAST:  13mL OMNIPAQUE IOHEXOL 350 MG/ML SOLN COMPARISON:  No prior CTA, correlation is made  with CT head 01/22/2021 FINDINGS: CT HEAD FINDINGS For noncontrast findings, please see same day CT head. CTA NECK FINDINGS Aortic arch: Standard branching. Imaged portion shows no evidence of aneurysm or dissection. No significant stenosis of the major arch vessel origins. Right carotid system: No evidence of dissection, stenosis (50% or greater) or occlusion. Left carotid system: No evidence of dissection, stenosis (50% or greater) or occlusion. Vertebral arteries: Right dominant. No evidence of dissection, stenosis (50% or greater) or  occlusion. Skeleton: No acute osseous abnormality. Other neck: Negative. Upper chest: Negative. Review of the MIP images confirms the above findings CTA HEAD FINDINGS Anterior circulation: Both internal carotid arteries are patent to the termini, without stenosis or other abnormality. A1 segments patent. Normal anterior communicating artery. Anterior cerebral arteries are patent to their opacified distal aspects. No M1 stenosis or occlusion. Normal MCA bifurcations. Evaluation of the distal MCA branches is somewhat limited by bolus timing; however within this limitation, distal MCA branches appear perfused and symmetric. Posterior circulation: The left vertebral artery primarily supplies PICA, with a diminutive branch continuing to the vertebrobasilar junction. The right vertebral artery is patent to the vertebrobasilar junction. Basilar patent to its distal aspect. Superior cerebellar arteries patent bilaterally. The left P1 originates from the basilar artery. Fetal origin of the right PCA. PCAs perfused to their distal aspects without stenosis. The left posterior communicating artery is not visualized. Venous sinuses: As permitted by contrast timing, patent. Anatomic variants: None significant Review of the MIP images confirms the above findings IMPRESSION: 1.  No intracranial large vessel occlusion or significant stenosis. 2.  No hemodynamically significant stenosis in the neck. Electronically Signed   By: Merilyn Baba M.D.   On: 01/22/2021 19:50   CT HEAD WO CONTRAST  Result Date: 01/22/2021 CLINICAL DATA:  Transient neuro deficit. EXAM: CT HEAD WITHOUT CONTRAST TECHNIQUE: Contiguous axial images were obtained from the base of the skull through the vertex without intravenous contrast. RADIATION DOSE REDUCTION: This exam was performed according to the departmental dose-optimization program which includes automated exposure control, adjustment of the mA and/or kV according to patient size and/or use of  iterative reconstruction technique. COMPARISON:  None. FINDINGS: Brain: The ventricles are normal in size and configuration. No extra-axial fluid collections are identified. The gray-white differentiation is maintained. No CT findings for acute hemispheric infarction or intracranial hemorrhage. No mass lesions. The brainstem and cerebellum are normal. Vascular: No hyperdense vessels or obvious aneurysm. Skull: No acute skull fracture.  No bone lesion. Sinuses/Orbits: The paranasal sinuses and mastoid air cells are clear. The globes are intact. Other: No scalp lesions, laceration or hematoma. IMPRESSION: No acute intracranial findings or mass lesions. Electronically Signed   By: Marijo Sanes M.D.   On: 01/22/2021 18:38   MR BRAIN WO CONTRAST  Result Date: 01/24/2021 CLINICAL DATA:  Initial evaluation for acute neuro deficit, stroke suspected. EXAM: MRI HEAD WITHOUT CONTRAST TECHNIQUE: Multiplanar, multiecho pulse sequences of the brain and surrounding structures were obtained without intravenous contrast. COMPARISON:  CT from 01/22/2021. FINDINGS: Brain: Examinations are with ectomy limited by susceptibility artifact emanating from the left frontal scalp. Cerebral volume within normal limits. No significant cerebral white matter disease for age. No evidence for acute or subacute infarct. Gray-white matter differentiation maintained. No encephalomalacia to suggest chronic cortical infarction. No acute or chronic intracranial hemorrhage. No mass lesion or midline shift. No hydrocephalus or extra-axial fluid collection. Pituitary gland suprasellar region normal. Midline structures intact. Vascular: Major intracranial vascular flow voids are maintained. Skull and  upper cervical spine: Craniocervical junction normal. Bone marrow signal intensity normal. No acute scalp soft tissue abnormality. Sinuses/Orbits: Globes orbital soft tissues within normal limits. Mild mucosal thickening noted within the ethmoidal air cells  and left maxillary sinus. No significant mastoid effusion. Other: None. IMPRESSION: Negative brain MRI for age. No acute intracranial abnormality identified. Electronically Signed   By: Rise MuBenjamin  McClintock M.D.   On: 01/24/2021 03:41   ECHOCARDIOGRAM COMPLETE  Result Date: 01/24/2021    ECHOCARDIOGRAM REPORT   Patient Name:   Austin Harrison Date of Exam: 01/24/2021 Medical Rec #:  [id number]     Height:       71.5 in Accession #:    [id number]    Weight:       204.0 lb Date of Birth:  1960-11-17    BSA:          2.137 m Patient Age:    60 years      BP:           140/82 mmHg Patient Gender: M             HR:           80 bpm. Exam Location:  Inpatient Procedure: 2D Echo, Cardiac Doppler and Color Doppler Indications:    TIA  History:        Patient has no prior history of Echocardiogram examinations. NO                 HISTORY.  Sonographer:    Festus BarrenJeanine Russo Referring Phys: [id number] ALLISON WOLFE IMPRESSIONS  1. Left ventricular ejection fraction, by estimation, is 55 to 60%. The left ventricle has normal function. The left ventricle has no regional wall motion abnormalities. Left ventricular diastolic parameters were normal.  2. Right ventricular systolic function is normal. The right ventricular size is normal.  3. The mitral valve is normal in structure. No evidence of mitral valve regurgitation. No evidence of mitral stenosis.  4. The aortic valve is tricuspid. There is mild calcification of the aortic valve. Aortic valve regurgitation is not visualized. Aortic valve sclerosis is present, with no evidence of aortic valve stenosis.  5. The inferior vena cava is normal in size with greater than 50% respiratory variability, suggesting right atrial pressure of 3 mmHg. FINDINGS  Left Ventricle: Left ventricular ejection fraction, by estimation, is 55 to 60%. The left ventricle has normal function. The left ventricle has no regional wall motion abnormalities. The left ventricular internal cavity size was normal in  size. There is  no left ventricular hypertrophy. Left ventricular diastolic parameters were normal. Right Ventricle: IVC NOT SEEN. The right ventricular size is normal. No increase in right ventricular wall thickness. Right ventricular systolic function is normal. Left Atrium: Left atrial size was normal in size. Right Atrium: Right atrial size was normal in size. Pericardium: There is no evidence of pericardial effusion. Mitral Valve: The mitral valve is normal in structure. No evidence of mitral valve regurgitation. No evidence of mitral valve stenosis. Tricuspid Valve: The tricuspid valve is normal in structure. Tricuspid valve regurgitation is trivial. No evidence of tricuspid stenosis. Aortic Valve: The aortic valve is tricuspid. There is mild calcification of the aortic valve. Aortic valve regurgitation is not visualized. Aortic valve sclerosis is present, with no evidence of aortic valve stenosis. Aortic valve mean gradient measures 6.0 mmHg. Aortic valve peak gradient measures 11.2 mmHg. Pulmonic Valve: The pulmonic valve was normal in structure. Pulmonic valve regurgitation is not visualized. No evidence of  pulmonic stenosis. Aorta: The aortic root is normal in size and structure. Venous: The inferior vena cava is normal in size with greater than 50% respiratory variability, suggesting right atrial pressure of 3 mmHg. IAS/Shunts: The interatrial septum was not well visualized.  LEFT VENTRICLE PLAX 2D LVIDd:         4.80 cm     Diastology LVIDs:         2.60 cm     LV e' medial:    7.94 cm/s LV PW:         0.90 cm     LV E/e' medial:  9.6 LV IVS:        0.90 cm     LV e' lateral:   10.80 cm/s                            LV E/e' lateral: 7.0  LV Volumes (MOD) LV vol d, MOD A2C: 59.7 ml LV vol d, MOD A4C: 79.8 ml LV vol s, MOD A2C: 23.4 ml LV vol s, MOD A4C: 35.5 ml LV SV MOD A2C:     36.3 ml LV SV MOD A4C:     79.8 ml LV SV MOD BP:      41.3 ml RIGHT VENTRICLE RV S prime:     14.60 cm/s TAPSE (M-mode): 2.4 cm  LEFT ATRIUM             Index        RIGHT ATRIUM           Index LA diam:        3.70 cm 1.73 cm/m   RA Area:     18.20 cm LA Vol (A2C):   45.4 ml 21.24 ml/m  RA Volume:   50.90 ml  23.81 ml/m LA Vol (A4C):   52.5 ml 24.56 ml/m LA Biplane Vol: 51.2 ml 23.95 ml/m  AORTIC VALVE                    PULMONIC VALVE AV Vmax:           167.00 cm/s  PV Vmax:       0.72 m/s AV Vmean:          117.000 cm/s PV Vmean:      44.900 cm/s AV VTI:            0.308 m      PV VTI:        0.127 m AV Peak Grad:      11.2 mmHg    PV Peak grad:  2.1 mmHg AV Mean Grad:      6.0 mmHg     PV Mean grad:  1.0 mmHg LVOT Vmax:         94.30 cm/s LVOT Vmean:        59.400 cm/s LVOT VTI:          0.171 m LVOT/AV VTI ratio: 0.56  AORTA Ao Root diam: 2.60 cm Ao Asc diam:  2.50 cm MITRAL VALVE MV Area (PHT): 3.08 cm    SHUNTS MV Decel Time: 246 msec    Systemic VTI: 0.17 m MV E velocity: 75.90 cm/s MV A velocity: 71.00 cm/s MV E/A ratio:  1.07 Jenkins Rouge MD Electronically signed by Jenkins Rouge MD Signature Date/Time: 01/24/2021/1:08:42 PM    Final     DISCHARGE EXAMINATION: Vitals:   01/23/21 1216 01/23/21 1219 01/23/21 1550 01/24/21 0715  BP: Marland Kitchen)  158/92 (!) 168/92 129/78 140/82  Pulse: 76 72 76 72  Resp: 18  18 18   Temp: 98 F (36.7 C)  98.1 F (36.7 C) 97.8 F (36.6 C)  TempSrc: Oral  Oral Oral  SpO2: 99%  98% 97%  Weight:      Height:       General appearance: Awake alert.  In no distress Resp: Clear to auscultation bilaterally.  Normal effort Cardio: S1-S2 is normal regular.  No S3-S4.  No rubs murmurs or bruit GI: Abdomen is soft.  Nontender nondistended.  Bowel sounds are present normal.  No masses organomegaly Extremities: No edema.  Full range of motion of lower extremities. Neurologic: Alert and oriented x3.  No focal neurological deficits.    DISPOSITION: Home  Discharge Instructions     Ambulatory referral to Neurology   Complete by: As directed    Follow up with stroke clinic NP [facility], if  not available, consider Zachery Dauer, or [place]) at [facility] in about 4 weeks. Thanks.   Call MD for:  difficulty breathing, headache or visual disturbances   Complete by: As directed    Call MD for:  extreme fatigue   Complete by: As directed    Call MD for:  persistant dizziness or light-headedness   Complete by: As directed    Call MD for:  persistant nausea and vomiting   Complete by: As directed    Call MD for:  severe uncontrolled pain   Complete by: As directed    Call MD for:  temperature >100.4   Complete by: As directed    Diet - low sodium heart healthy   Complete by: As directed    Discharge instructions   Complete by: As directed    Please be sure to follow-up with your primary care provider in 1 week.  A referral has been sent to neurology to arrange follow-up with them in 4 weeks time.  Take your medications as prescribed.  Seek attention if your symptoms recur.  You were cared for by a hospitalist during your hospital stay. If you have any questions about your discharge medications or the care you received while you were in the hospital after you are discharged, you can call the unit and asked to speak with the hospitalist on call if the hospitalist that took care of you is not available. Once you are discharged, your primary care physician will handle any further medical issues. Please note that NO REFILLS for any discharge medications will be authorized once you are discharged, as it is imperative that you return to your primary care physician (or establish a relationship with a primary care physician if you do not have one) for your aftercare needs so that they can reassess your need for medications and monitor your lab values. If you do not have a primary care physician, you can call [phone] for a physician referral.   Increase activity slowly   Complete by: As directed          Allergies as of 01/24/2021       Reactions   Codeine Nausea And Vomiting            Medication List     TAKE these medications    aspirin 81 MG EC tablet Take 1 tablet (81 mg total) by mouth daily. Swallow whole.   atorvastatin 40 MG tablet Commonly known as: LIPITOR Take 1 tablet (40 mg total) by mouth daily.   clopidogrel 75 MG tablet Commonly  known as: PLAVIX Take 1 tablet (75 mg total) by mouth daily.   omeprazole 20 MG tablet Commonly known as: PRILOSEC OTC Take 20 mg by mouth daily.   tamsulosin 0.4 MG Caps capsule Commonly known as: FLOMAX Take 0.4 mg by mouth at bedtime.          Follow-up Information     [place], Eather Colas. Schedule an appointment as soon as possible for a visit in 1 week(s).   Specialty: Internal Medicine Contact information: [address] [place] [address] [phone]         [facility]. Schedule an appointment as soon as possible for a visit in 1 month(s).   Specialty: Neurology Why: stroke clinic Contact information: [place] [phone]                TOTAL DISCHARGE TIME: 35 minutes  [place]  [facility] Pager on [email or web address]  01/25/2021, 12:03 PM

## 2021-01-24 NOTE — Progress Notes (Signed)
OT Cancellation Note  Patient Details Name: Austin Harrison MRN: [id number] DOB: 01-Nov-1960   Cancelled Treatment:    Reason Eval/Treat Not Completed: OT screened, no needs identified, will sign off. PT and MD report pt is back at his baseline. Overall balance is good and having no difficulties with BADL's. Pt has no OT needs and acute OT will sign off.   Austin Helton H., Austin Harrison [facility]  Austin Harrison 01/24/2021, 2:03 PM

## 2021-01-24 NOTE — Progress Notes (Signed)
° °  Echocardiogram 2D Echocardiogram has been performed.  Austin Harrison 01/24/2021, 10:16 AM

## 2021-01-24 NOTE — Progress Notes (Signed)
STROKE TEAM PROGRESS NOTE   SUBJECTIVE (INTERVAL HISTORY) His daughter is at the bedside.  Overall his condition is completely resolved. Pt stated that last Thursday, he was in [employer], with standing up from bending down position, he felt warmness sensation from his right head, face, arm down to leg and toes. He also had wobbly gait and right arm weakness and right facial droop. Symptoms resolved in 1 hours and 15min or so. After that he did have some HA at bifrontal area x 2. Denies hx of migraine. However, daughter has migraine though.    OBJECTIVE Temp:  [97.8 F (36.6 C)-98.1 F (36.7 C)] 97.8 F (36.6 C) (01/21 0715) Pulse Rate:  [72-76] 72 (01/21 0715) Cardiac Rhythm: Normal sinus rhythm;Heart block (01/21 1000) Resp:  [18] 18 (01/21 0715) BP: (129-140)/(78-82) 140/82 (01/21 0715) SpO2:  [97 %-98 %] 97 % (01/21 0715)  Recent Labs  Lab 01/22/21 1756  GLUCAP 106*   Recent Labs  Lab 01/22/21 1758  NA 137  K 3.7  CL 103  CO2 26  GLUCOSE 110*  BUN 16  CREATININE 1.10  CALCIUM 8.6*   Recent Labs  Lab 01/22/21 1758  AST 19  ALT 18  ALKPHOS 80  BILITOT 0.8  PROT 6.8  ALBUMIN 3.8   Recent Labs  Lab 01/22/21 1758  WBC 7.6  NEUTROABS 3.6  HGB 15.4  HCT 42.5  MCV 85.9  PLT 209   No results for input(s): CKTOTAL, CKMB, CKMBINDEX, TROPONINI in the last 168 hours. Recent Labs    01/22/21 1758  LABPROT 12.2  INR 0.9   Recent Labs    01/22/21 1758  COLORURINE YELLOW  LABSPEC 1.025  PHURINE 7.0  GLUCOSEU NEGATIVE  HGBUR NEGATIVE  BILIRUBINUR NEGATIVE  KETONESUR NEGATIVE  PROTEINUR NEGATIVE  NITRITE NEGATIVE  LEUKOCYTESUR NEGATIVE       Component Value Date/Time   CHOL 139 01/23/2021 1421   TRIG 44 01/23/2021 1421   HDL 48 01/23/2021 1421   CHOLHDL 2.9 01/23/2021 1421   VLDL 9 01/23/2021 1421   LDLCALC 82 01/23/2021 1421   Lab Results  Component Value Date   HGBA1C 5.6 01/23/2021      Component Value Date/Time   LABOPIA NONE DETECTED  01/22/2021 1758   COCAINSCRNUR NONE DETECTED 01/22/2021 1758   LABBENZ NONE DETECTED 01/22/2021 1758   AMPHETMU NONE DETECTED 01/22/2021 1758   THCU NONE DETECTED 01/22/2021 1758   LABBARB NONE DETECTED 01/22/2021 1758    Recent Labs  Lab 01/22/21 1758  ETH <10    I have personally reviewed the radiological images below and agree with the radiology interpretations.  CT ANGIO HEAD NECK W WO CM  Result Date: 01/22/2021 CLINICAL DATA:  Transient ischemic attack, 15 minute episode of right-sided weakness and facial droop, all symptoms have resolved. EXAM: CT ANGIOGRAPHY HEAD AND NECK TECHNIQUE: Multidetector CT imaging of the head and neck was performed using the standard protocol during bolus administration of intravenous contrast. Multiplanar CT image reconstructions and MIPs were obtained to evaluate the vascular anatomy. Carotid stenosis measurements (when applicable) are obtained utilizing NASCET criteria, using the distal internal carotid diameter as the denominator. RADIATION DOSE REDUCTION: This exam was performed according to the departmental dose-optimization program which includes automated exposure control, adjustment of the mA and/or kV according to patient size and/or use of iterative reconstruction technique. CONTRAST:  80mL OMNIPAQUE IOHEXOL 350 MG/ML SOLN COMPARISON:  No prior CTA, correlation is made with CT head 01/22/2021 FINDINGS: CT HEAD FINDINGS For noncontrast  findings, please see same day CT head. CTA NECK FINDINGS Aortic arch: Standard branching. Imaged portion shows no evidence of aneurysm or dissection. No significant stenosis of the major arch vessel origins. Right carotid system: No evidence of dissection, stenosis (50% or greater) or occlusion. Left carotid system: No evidence of dissection, stenosis (50% or greater) or occlusion. Vertebral arteries: Right dominant. No evidence of dissection, stenosis (50% or greater) or occlusion. Skeleton: No acute osseous abnormality.  Other neck: Negative. Upper chest: Negative. Review of the MIP images confirms the above findings CTA HEAD FINDINGS Anterior circulation: Both internal carotid arteries are patent to the termini, without stenosis or other abnormality. A1 segments patent. Normal anterior communicating artery. Anterior cerebral arteries are patent to their opacified distal aspects. No M1 stenosis or occlusion. Normal MCA bifurcations. Evaluation of the distal MCA branches is somewhat limited by bolus timing; however within this limitation, distal MCA branches appear perfused and symmetric. Posterior circulation: The left vertebral artery primarily supplies PICA, with a diminutive branch continuing to the vertebrobasilar junction. The right vertebral artery is patent to the vertebrobasilar junction. Basilar patent to its distal aspect. Superior cerebellar arteries patent bilaterally. The left P1 originates from the basilar artery. Fetal origin of the right PCA. PCAs perfused to their distal aspects without stenosis. The left posterior communicating artery is not visualized. Venous sinuses: As permitted by contrast timing, patent. Anatomic variants: None significant Review of the MIP images confirms the above findings IMPRESSION: 1.  No intracranial large vessel occlusion or significant stenosis. 2.  No hemodynamically significant stenosis in the neck. Electronically Signed   By: Wiliam Ke M.D.   On: 01/22/2021 19:50   CT HEAD WO CONTRAST  Result Date: 01/22/2021 CLINICAL DATA:  Transient neuro deficit. EXAM: CT HEAD WITHOUT CONTRAST TECHNIQUE: Contiguous axial images were obtained from the base of the skull through the vertex without intravenous contrast. RADIATION DOSE REDUCTION: This exam was performed according to the departmental dose-optimization program which includes automated exposure control, adjustment of the mA and/or kV according to patient size and/or use of iterative reconstruction technique. COMPARISON:  None.  FINDINGS: Brain: The ventricles are normal in size and configuration. No extra-axial fluid collections are identified. The gray-white differentiation is maintained. No CT findings for acute hemispheric infarction or intracranial hemorrhage. No mass lesions. The brainstem and cerebellum are normal. Vascular: No hyperdense vessels or obvious aneurysm. Skull: No acute skull fracture.  No bone lesion. Sinuses/Orbits: The paranasal sinuses and mastoid air cells are clear. The globes are intact. Other: No scalp lesions, laceration or hematoma. IMPRESSION: No acute intracranial findings or mass lesions. Electronically Signed   By: Rudie Meyer M.D.   On: 01/22/2021 18:38   MR BRAIN WO CONTRAST  Result Date: 01/24/2021 CLINICAL DATA:  Initial evaluation for acute neuro deficit, stroke suspected. EXAM: MRI HEAD WITHOUT CONTRAST TECHNIQUE: Multiplanar, multiecho pulse sequences of the brain and surrounding structures were obtained without intravenous contrast. COMPARISON:  CT from 01/22/2021. FINDINGS: Brain: Examinations are with ectomy limited by susceptibility artifact emanating from the left frontal scalp. Cerebral volume within normal limits. No significant cerebral white matter disease for age. No evidence for acute or subacute infarct. Gray-white matter differentiation maintained. No encephalomalacia to suggest chronic cortical infarction. No acute or chronic intracranial hemorrhage. No mass lesion or midline shift. No hydrocephalus or extra-axial fluid collection. Pituitary gland suprasellar region normal. Midline structures intact. Vascular: Major intracranial vascular flow voids are maintained. Skull and upper cervical spine: Craniocervical junction normal. Bone marrow signal intensity  normal. No acute scalp soft tissue abnormality. Sinuses/Orbits: Globes orbital soft tissues within normal limits. Mild mucosal thickening noted within the ethmoidal air cells and left maxillary sinus. No significant mastoid  effusion. Other: None. IMPRESSION: Negative brain MRI for age. No acute intracranial abnormality identified. Electronically Signed   By: Rise Mu M.D.   On: 01/24/2021 03:41   ECHOCARDIOGRAM COMPLETE  Result Date: 01/24/2021    ECHOCARDIOGRAM REPORT   Patient Name:   ELIA KEENUM Date of Exam: 01/24/2021 Medical Rec #:  [id number]     Height:       71.5 in Accession #:    [id number]    Weight:       204.0 lb Date of Birth:  December 17, 1960    BSA:          2.137 m Patient Age:    60 years      BP:           140/82 mmHg Patient Gender: M             HR:           80 bpm. Exam Location:  Inpatient Procedure: 2D Echo, Cardiac Doppler and Color Doppler Indications:    TIA  History:        Patient has no prior history of Echocardiogram examinations. NO                 HISTORY.  Sonographer:    Festus Barren Referring Phys: [id number] ALLISON WOLFE IMPRESSIONS  1. Left ventricular ejection fraction, by estimation, is 55 to 60%. The left ventricle has normal function. The left ventricle has no regional wall motion abnormalities. Left ventricular diastolic parameters were normal.  2. Right ventricular systolic function is normal. The right ventricular size is normal.  3. The mitral valve is normal in structure. No evidence of mitral valve regurgitation. No evidence of mitral stenosis.  4. The aortic valve is tricuspid. There is mild calcification of the aortic valve. Aortic valve regurgitation is not visualized. Aortic valve sclerosis is present, with no evidence of aortic valve stenosis.  5. The inferior vena cava is normal in size with greater than 50% respiratory variability, suggesting right atrial pressure of 3 mmHg. FINDINGS  Left Ventricle: Left ventricular ejection fraction, by estimation, is 55 to 60%. The left ventricle has normal function. The left ventricle has no regional wall motion abnormalities. The left ventricular internal cavity size was normal in size. There is  no left ventricular hypertrophy.  Left ventricular diastolic parameters were normal. Right Ventricle: IVC NOT SEEN. The right ventricular size is normal. No increase in right ventricular wall thickness. Right ventricular systolic function is normal. Left Atrium: Left atrial size was normal in size. Right Atrium: Right atrial size was normal in size. Pericardium: There is no evidence of pericardial effusion. Mitral Valve: The mitral valve is normal in structure. No evidence of mitral valve regurgitation. No evidence of mitral valve stenosis. Tricuspid Valve: The tricuspid valve is normal in structure. Tricuspid valve regurgitation is trivial. No evidence of tricuspid stenosis. Aortic Valve: The aortic valve is tricuspid. There is mild calcification of the aortic valve. Aortic valve regurgitation is not visualized. Aortic valve sclerosis is present, with no evidence of aortic valve stenosis. Aortic valve mean gradient measures 6.0 mmHg. Aortic valve peak gradient measures 11.2 mmHg. Pulmonic Valve: The pulmonic valve was normal in structure. Pulmonic valve regurgitation is not visualized. No evidence of pulmonic stenosis. Aorta: The aortic root is normal in size  and structure. Venous: The inferior vena cava is normal in size with greater than 50% respiratory variability, suggesting right atrial pressure of 3 mmHg. IAS/Shunts: The interatrial septum was not well visualized.  LEFT VENTRICLE PLAX 2D LVIDd:         4.80 cm     Diastology LVIDs:         2.60 cm     LV e' medial:    7.94 cm/s LV PW:         0.90 cm     LV E/e' medial:  9.6 LV IVS:        0.90 cm     LV e' lateral:   10.80 cm/s                            LV E/e' lateral: 7.0  LV Volumes (MOD) LV vol d, MOD A2C: 59.7 ml LV vol d, MOD A4C: 79.8 ml LV vol s, MOD A2C: 23.4 ml LV vol s, MOD A4C: 35.5 ml LV SV MOD A2C:     36.3 ml LV SV MOD A4C:     79.8 ml LV SV MOD BP:      41.3 ml RIGHT VENTRICLE RV S prime:     14.60 cm/s TAPSE (M-mode): 2.4 cm LEFT ATRIUM             Index        RIGHT ATRIUM            Index LA diam:        3.70 cm 1.73 cm/m   RA Area:     18.20 cm LA Vol (A2C):   45.4 ml 21.24 ml/m  RA Volume:   50.90 ml  23.81 ml/m LA Vol (A4C):   52.5 ml 24.56 ml/m LA Biplane Vol: 51.2 ml 23.95 ml/m  AORTIC VALVE                    PULMONIC VALVE AV Vmax:           167.00 cm/s  PV Vmax:       0.72 m/s AV Vmean:          117.000 cm/s PV Vmean:      44.900 cm/s AV VTI:            0.308 m      PV VTI:        0.127 m AV Peak Grad:      11.2 mmHg    PV Peak grad:  2.1 mmHg AV Mean Grad:      6.0 mmHg     PV Mean grad:  1.0 mmHg LVOT Vmax:         94.30 cm/s LVOT Vmean:        59.400 cm/s LVOT VTI:          0.171 m LVOT/AV VTI ratio: 0.56  AORTA Ao Root diam: 2.60 cm Ao Asc diam:  2.50 cm MITRAL VALVE MV Area (PHT): 3.08 cm    SHUNTS MV Decel Time: 246 msec    Systemic VTI: 0.17 m MV E velocity: 75.90 cm/s MV A velocity: 71.00 cm/s MV E/A ratio:  1.07 Charlton Haws MD Electronically signed by Charlton Haws MD Signature Date/Time: 01/24/2021/1:08:42 PM    Final       PHYSICAL EXAM  Temp:  [97.8 F (36.6 C)-98.1 F (36.7 C)] 97.8 F (36.6 C) (01/21 0715) Pulse Rate:  [72-76] 72 (  01/21 0715) Resp:  [18] 18 (01/21 0715) BP: (129-140)/(78-82) 140/82 (01/21 0715) SpO2:  [97 %-98 %] 97 % (01/21 0715)  General - Well nourished, well developed, in no apparent distress.  Ophthalmologic - fundi not visualized due to noncooperation.  Cardiovascular - Regular rhythm and rate.  Mental Status -  Level of arousal and orientation to time, place, and person were intact. Language including expression, naming, repetition, comprehension was assessed and found intact. Attention span and concentration were normal. Fund of Knowledge was assessed and was intact.  Cranial Nerves II - XII - II - Visual field intact OU. III, IV, VI - Extraocular movements intact. V - Facial sensation intact bilaterally. VII - Facial movement intact bilaterally. VIII - Hearing & vestibular intact bilaterally. X -  Palate elevates symmetrically. XI - Chin turning & shoulder shrug intact bilaterally. XII - Tongue protrusion intact.  Motor Strength - The patients strength was normal in all extremities and pronator drift was absent.  Bulk was normal and fasciculations were absent.   Motor Tone - Muscle tone was assessed at the neck and appendages and was normal.  Reflexes - The patients reflexes were symmetrical in all extremities and he had no pathological reflexes.  Sensory - Light touch, temperature/pinprick were assessed and were symmetrical.    Coordination - The patient had normal movements in the hands and feet with no ataxia or dysmetria.  Tremor was absent.  Gait and Station - deferred.   ASSESSMENT/PLAN Mr. Austin Harrison is a 61 y.o. male with no significant medical history admitted for transient right sided numbness and weakness, right facial droop followed by mild HA. No tPA given due to symptoms resolved.    Possible TIA:  left brain TIA. However complicated migraine is still in DDx  CT no acute abnormalities MRI  no acute infarct CTA head and neck unremarkable. 2D Echo  EF 55-60% LDL 82 HgbA1c 5.6 UDS neg SCDs for VTE prophylaxis No antithrombotic prior to admission, now on aspirin 81 mg daily and clopidogrel 75 mg daily DAPT for 3 weeks and then ASA alone Patient counseled to be compliant with his antithrombotic medications Ongoing aggressive stroke risk factor management Therapy recommendations:  none  Disposition:  home  Hyperlipidemia Home meds:  none  LDL 82, goal < 70 Now on lipitor 40 Continue statin at discharge  Other Stroke Risk Factors ? HA post event - no hx of migraine but daughter has migraine  Other Active Problems   Hospital day # 0  Neurology will sign off. Please call with questions. [facility] about 4 weeks. Thanks for the consult.   Marvel PlanJindong Esther Broyles, MD PhD Stroke Neurology 01/24/2021 1:50 PM    To contact  Stroke Continuity provider, please refer to [email or web address]. After hours, contact General Neurology

## 2021-03-09 NOTE — Progress Notes (Deleted)
? ? ?PATIENT: Austin Harrison ?DOB: [birth year] ? ?REASON FOR VISIT: follow up ?HISTORY FROM: patient ?PRIMARY NEUROLOGIST: Dr. Pearlean Brownie ? ?HISTORY OF PRESENT ILLNESS: ?Today 03/09/21: ? ?Austin Harrison is a 61 year old male with a history of possible left brain TIA.  The patient was in [employer] on January 19 and was bending over.  Once he stood up he felt a warm sensation from the right head face arm down to the leg and toes.  Reports that he noted right arm weakness and wobbly gait.  Symptoms resolved in about an hour.  He did have a headache afterwards.  CT/CTA and MRI brain was unremarkable.  2D echo showed ejection fraction 55 to 60%.  LDL was 82 he was discharged on Lipitor.  Hemoglobin A1c 5.6.  Urine drug screen was negative.  He was discharged on aspirin 81 mg daily and Plavix 75 mg daily ? ?Today: He reports that he has not had any additional strokelike symptoms. ? ?HISTORY  ? ?REVIEW OF SYSTEMS: Out of a complete 14 system review of symptoms, the patient complains only of the following symptoms, and all other reviewed systems are negative. ? ?ALLERGIES: ?Allergies  ?Allergen Reactions  ? Codeine Nausea And Vomiting  ?   ?  ? ? ?HOME MEDICATIONS: ?Outpatient Medications Prior to Visit  ?Medication Sig Dispense Refill  ? aspirin EC 81 MG EC tablet Take 1 tablet (81 mg total) by mouth daily. Swallow whole. 30 tablet 11  ? atorvastatin (LIPITOR) 40 MG tablet Take 1 tablet (40 mg total) by mouth daily. 30 tablet 2  ? clopidogrel (PLAVIX) 75 MG tablet Take 1 tablet (75 mg total) by mouth daily. 21 tablet 0  ? omeprazole (PRILOSEC OTC) 20 MG tablet Take 20 mg by mouth daily.    ? tamsulosin (FLOMAX) 0.4 MG CAPS capsule Take 0.4 mg by mouth at bedtime.    ? ?No facility-administered medications prior to visit.  ? ? ?PAST MEDICAL HISTORY: ?No past medical history on file. ? ?PAST SURGICAL HISTORY: ?Past Surgical History:  ?Procedure Laterality Date  ? CHOLECYSTECTOMY    ? ? ?FAMILY HISTORY: ?No family history on  file. ? ?SOCIAL HISTORY: ?Social History  ? ?Socioeconomic History  ? Marital status: Married  ?  Spouse name: Not on file  ? Number of children: Not on file  ? Years of education: Not on file  ? Highest education level: Not on file  ?Occupational History  ? Not on file  ?Tobacco Use  ? Smoking status: Never  ? Smokeless tobacco: Never  ?Vaping Use  ? Vaping Use: Never used  ?Substance and Sexual Activity  ? Alcohol use: Never  ? Drug use: Never  ? Sexual activity: Not on file  ?Other Topics Concern  ? Not on file  ?Social History Narrative  ? Not on file  ? ?Social Determinants of Health  ? ?Financial Resource Strain: Not on file  ?Food Insecurity: Not on file  ?Transportation Needs: Not on file  ?Physical Activity: Not on file  ?Stress: Not on file  ?Social Connections: Not on file  ?Intimate Partner Violence: Not on file  ? ? ? ? ?PHYSICAL EXAM ? ?There were no vitals filed for this visit. ?There is no height or weight on file to calculate BMI. ? ?Generalized: Well developed, in no acute distress  ? ?Neurological examination  ?Mentation: Alert oriented to time, place, history taking. Follows all commands speech and language fluent ?Cranial nerve II-XII: Pupils were equal round reactive to light. Extraocular  movements were full, visual field were full on confrontational test. Facial sensation and strength were normal. Uvula tongue midline. Head turning and shoulder shrug  were normal and symmetric. ?Motor: The motor testing reveals 5 over 5 strength of all 4 extremities. Good symmetric motor tone is noted throughout.  ?Sensory: Sensory testing is intact to soft touch on all 4 extremities. No evidence of extinction is noted.  ?Coordination: Cerebellar testing reveals good finger-nose-finger and heel-to-shin bilaterally.  ?Gait and station: Gait is normal. Tandem gait is normal. Romberg is negative. No drift is seen.  ?Reflexes: Deep tendon reflexes are symmetric and normal bilaterally.  ? ?DIAGNOSTIC DATA (LABS,  IMAGING, TESTING) ?- I reviewed patient records, labs, notes, testing and imaging myself where available. ? ?Lab Results  ?Component Value Date  ? WBC 7.6 01/22/2021  ? HGB 15.4 01/22/2021  ? HCT 42.5 01/22/2021  ? MCV 85.9 01/22/2021  ? PLT 209 01/22/2021  ? ?   ?Component Value Date/Time  ? NA 137 01/22/2021 1758  ? K 3.7 01/22/2021 1758  ? CL 103 01/22/2021 1758  ? CO2 26 01/22/2021 1758  ? GLUCOSE 110 (H) 01/22/2021 1758  ? BUN 16 01/22/2021 1758  ? CREATININE 1.10 01/22/2021 1758  ? CALCIUM 8.6 (L) 01/22/2021 1758  ? PROT 6.8 01/22/2021 1758  ? ALBUMIN 3.8 01/22/2021 1758  ? AST 19 01/22/2021 1758  ? ALT 18 01/22/2021 1758  ? ALKPHOS 80 01/22/2021 1758  ? BILITOT 0.8 01/22/2021 1758  ? GFRNONAA >60 01/22/2021 1758  ? ?Lab Results  ?Component Value Date  ? CHOL 139 01/23/2021  ? HDL 48 01/23/2021  ? LDLCALC 82 01/23/2021  ? TRIG 44 01/23/2021  ? CHOLHDL 2.9 01/23/2021  ? ?Lab Results  ?Component Value Date  ? HGBA1C 5.6 01/23/2021  ? ?No results found for: VITAMINB12 ?No results found for: TSH ? ? ? ?ASSESSMENT AND PLAN ?61 y.o. year old male  has no past medical history on file. here with *** ? ? ? ? ?Butch Penny, MSN, NP-C 03/09/2021, 1:14 PM ?[facility] ?[address], [address] ?[place], [place] [address] ?[phone] ? ? ?

## 2021-03-10 ENCOUNTER — Inpatient Hospital Stay: Payer: 59 | Admitting: Adult Health

## 2021-04-07 ENCOUNTER — Telehealth: Payer: Self-pay | Admitting: *Deleted

## 2021-04-07 ENCOUNTER — Encounter: Payer: Self-pay | Admitting: Adult Health

## 2021-04-07 ENCOUNTER — Ambulatory Visit (INDEPENDENT_AMBULATORY_CARE_PROVIDER_SITE_OTHER): Payer: 59 | Admitting: Adult Health

## 2021-04-07 VITALS — BP 142/77 | HR 82 | Ht 71.0 in | Wt 210.0 lb

## 2021-04-07 DIAGNOSIS — G459 Transient cerebral ischemic attack, unspecified: Secondary | ICD-10-CM | POA: Diagnosis not present

## 2021-04-07 NOTE — Patient Instructions (Signed)
Continue aspirin 81 mg and Plavix 75 mg daily for the next 3 weeks then stop Plavix and continue aspirin ?Blood pressure goal less than 130/80 ?Cholesterol LDL less than 70 ?Hemoglobin A1c less than 6.5% ?Keep regular follow-ups with PCP ?Certainly if you have any strokelike symptoms go to the emergency room ?Follow-up in 6 months or sooner if needed ?

## 2021-04-07 NOTE — Telephone Encounter (Signed)
Called pt & LVM with office number and hours asking for call back.  ?

## 2021-04-07 NOTE — Progress Notes (Signed)
? ? ?PATIENT: Austin Harrison ?DOB: 10-12-1960 ? ?REASON FOR VISIT: follow up ?HISTORY FROM: patient ?PRIMARY NEUROLOGIST:  ? ?HISTORY OF PRESENT ILLNESS: ?Today 04/07/21: ? ?Austin Harrison is a 61 year old male with a history of transient right-sided numbness and weakness with a right facial droop followed by mild headache.  He went to the emergency room on January 24, 2021 but symptoms resolved fairly quickly.  Imaging was unremarkable.  Possible left brain TIA or complicated migraine.  2D echocardiogram showed ejection fraction of 55 to 60%, LDL 82 on Lipitor, hemoglobin A1c 5.6% patient was discharged from the hospital on aspirin 81 mg and Plavix 75 mg for 3 weeks and then aspirin alone after that. ? ? ?REVIEW OF SYSTEMS: Out of a complete 14 system review of symptoms, the patient complains only of the following symptoms, and all other reviewed systems are negative. ? ?See HPI ? ?ALLERGIES: ?Allergies  ?Allergen Reactions  ? Codeine Nausea And Vomiting  ?   ?  ? ? ?HOME MEDICATIONS: ?Outpatient Medications Prior to Visit  ?Medication Sig Dispense Refill  ? aspirin EC 81 MG EC tablet Take 1 tablet (81 mg total) by mouth daily. Swallow whole. 30 tablet 11  ? atorvastatin (LIPITOR) 40 MG tablet Take 1 tablet (40 mg total) by mouth daily. 30 tablet 2  ? clopidogrel (PLAVIX) 75 MG tablet Take 1 tablet (75 mg total) by mouth daily. 21 tablet 0  ? omeprazole (PRILOSEC OTC) 20 MG tablet Take 20 mg by mouth as needed.    ? tamsulosin (FLOMAX) 0.4 MG CAPS capsule Take 0.4 mg by mouth at bedtime.    ? ?No facility-administered medications prior to visit.  ? ? ?PAST MEDICAL HISTORY: ?History reviewed. No pertinent past medical history. ? ?PAST SURGICAL HISTORY: ?Past Surgical History:  ?Procedure Laterality Date  ? CHOLECYSTECTOMY    ? ? ?FAMILY HISTORY: ?Family History  ?Problem Relation Age of Onset  ? Stroke Neg Hx   ? ? ?SOCIAL HISTORY: ?Social History  ? ?Socioeconomic History  ? Marital status: Married  ?  Spouse name: Not  on file  ? Number of children: Not on file  ? Years of education: Not on file  ? Highest education level: Not on file  ?Occupational History  ? Not on file  ?Tobacco Use  ? Smoking status: Never  ? Smokeless tobacco: Never  ?Vaping Use  ? Vaping Use: Never used  ?Substance and Sexual Activity  ? Alcohol use: Never  ? Drug use: Never  ? Sexual activity: Not on file  ?Other Topics Concern  ? Not on file  ?Social History Narrative  ? Not on file  ? ?Social Determinants of Health  ? ?Financial Resource Strain: Not on file  ?Food Insecurity: Not on file  ?Transportation Needs: Not on file  ?Physical Activity: Not on file  ?Stress: Not on file  ?Social Connections: Not on file  ?Intimate Partner Violence: Not on file  ? ? ? ? ?PHYSICAL EXAM ? ?Vitals:  ? 04/07/21 1325  ?BP: (!) 142/77  ?Pulse: 82  ?Weight: 210 lb (95.3 kg)  ?Height: 5\' 11"  (1.803 m)  ? ?Body mass index is 29.29 kg/m?. ? ?Generalized: Well developed, in no acute distress  ? ?Neurological examination  ?Mentation: Alert oriented to time, place, history taking. Follows all commands speech and language fluent ?Cranial nerve II-XII: Pupils were equal round reactive to light. Extraocular movements were full, visual field were full on confrontational test. Facial sensation and strength were normal. Uvula tongue midline.  Head turning and shoulder shrug  were normal and symmetric. ?Motor: The motor testing reveals 5 over 5 strength of all 4 extremities. Good symmetric motor tone is noted throughout.  ?Sensory: Sensory testing is intact to soft touch on all 4 extremities. No evidence of extinction is noted.  ?Coordination: Cerebellar testing reveals good finger-nose-finger and heel-to-shin bilaterally.  ?Gait and station: Gait is normal.  ?Reflexes: Deep tendon reflexes are symmetric and normal bilaterally.  ? ?DIAGNOSTIC DATA (LABS, IMAGING, TESTING) ?- I reviewed patient records, labs, notes, testing and imaging myself where available. ? ?Lab Results  ?Component  Value Date  ? WBC 7.6 01/22/2021  ? HGB 15.4 01/22/2021  ? HCT 42.5 01/22/2021  ? MCV 85.9 01/22/2021  ? PLT 209 01/22/2021  ? ?   ?Component Value Date/Time  ? NA 137 01/22/2021 1758  ? K 3.7 01/22/2021 1758  ? CL 103 01/22/2021 1758  ? CO2 26 01/22/2021 1758  ? GLUCOSE 110 (H) 01/22/2021 1758  ? BUN 16 01/22/2021 1758  ? CREATININE 1.10 01/22/2021 1758  ? CALCIUM 8.6 (L) 01/22/2021 1758  ? PROT 6.8 01/22/2021 1758  ? ALBUMIN 3.8 01/22/2021 1758  ? AST 19 01/22/2021 1758  ? ALT 18 01/22/2021 1758  ? ALKPHOS 80 01/22/2021 1758  ? BILITOT 0.8 01/22/2021 1758  ? GFRNONAA >60 01/22/2021 1758  ? ?Lab Results  ?Component Value Date  ? CHOL 139 01/23/2021  ? HDL 48 01/23/2021  ? LDLCALC 82 01/23/2021  ? TRIG 44 01/23/2021  ? CHOLHDL 2.9 01/23/2021  ? ?Lab Results  ?Component Value Date  ? HGBA1C 5.6 01/23/2021  ? ? ? ? ? ?ASSESSMENT AND PLAN ?61 y.o. year old male  has no past medical history on file. here with : ? ?1.  Left brain TIA or possible complicated migraine ? ?Patient will continue on aspirin 81 mg and Plavix 75 mg for the next 3 weeks then he can stop Plavix and continue aspirin.  Encouraged the patient to monitor stroke risk factors with blood pressure goal less than 130/80, LDL goal less than 70 and hemoglobin A1c less than 6.5%.  He states that he follows up with his PCP regularly.  Reports that orthostatic vital signs have been completed at that office and was unremarkable (I have not seen these notes.)  Patient is encouraged if he has any additional events he should go back to the emergency room.  Certainly if he starts having headaches he should let us know.  He will follow-up on an as-needed basis ? ? ? ? ?Austin Penny, MSN, NP-C 04/07/2021, 2:09 PM ?[facility] ?[address], [address] ?[place], [place] [address] ?([phone] ? ? ?

## 2021-04-07 NOTE — Telephone Encounter (Signed)
-----   Message from Butch Penny, NP sent at 04/07/2021  2:35 PM EDT ----- ?Please call the patient and make sure he understands that he will continue aspirin and Plavix for 3 weeks then stop Plavix and continue aspirin ?

## 2021-04-08 NOTE — Telephone Encounter (Signed)
Spoke with patient.  He understands the plan to take both Aspirin and Plavix daily for 3 weeks and then stop Plavix and continue Aspirin daily thereafter.  Also reiterated the doses, 75 mg of Plavix daily and 81 mg of aspirin daily.  She patient states he gets refills through his primary doctor.  He verbalized appreciation for the call and he did not have any questions. ?

## 2021-04-08 NOTE — Addendum Note (Signed)
Addended by: Trudie Buckler on: 04/08/2021 03:24 PM ? ? Modules accepted: Level of Service ? ?

## 2023-08-17 IMAGING — MR MR HEAD W/O CM
10 of 11 series · 42 of 48 positions shown · non-contrast
Comparison: CT from 01/22/2021.

CLINICAL DATA: Initial evaluation for acute neuro deficit, stroke
suspected.

EXAM:
MRI HEAD WITHOUT CONTRAST
TECHNIQUE: Multiplanar, multiecho pulse sequences of the brain and surrounding
structures were obtained without intravenous contrast.

[Series 9: DWI · axial · 3.0mm · 0.88mm/px · z∈[-60,+78]mm · 9 of 96 slices shown (1 of 4)]
[im 1/96]
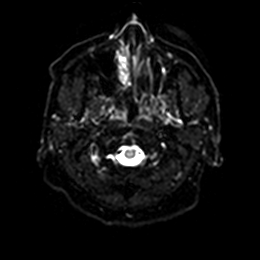
[im 12/96]
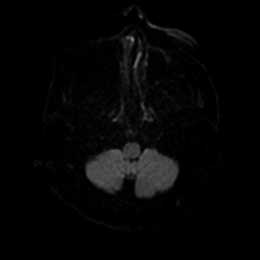
[im 24/96]
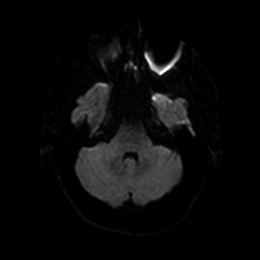
[im 36/96]
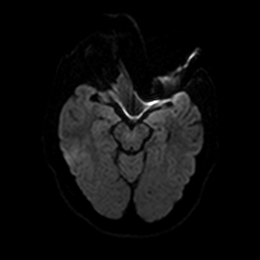
[im 48/96]
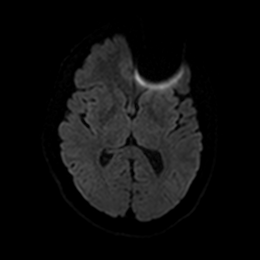
[im 60/96]
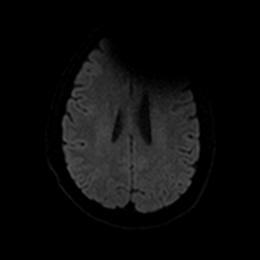
[im 72/96]
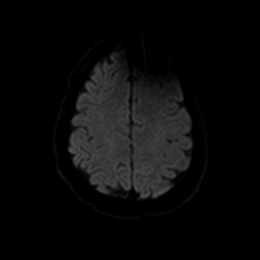
[im 84/96]
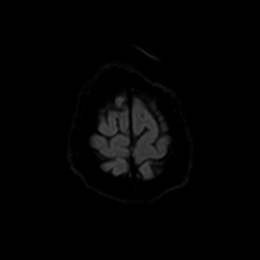
[im 96/96]
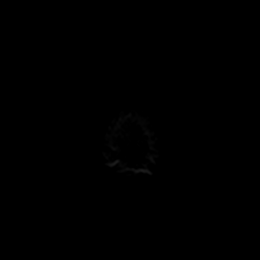

[Series 10: DWI · axial · 3.0mm · 0.88mm/px · z∈[-60,+78]mm · 4 of 48 slices shown (2 of 4)]
[im 1/48]
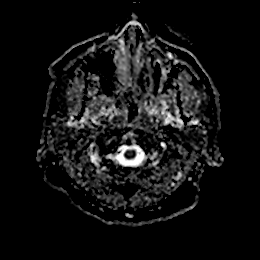
[im 16/48]
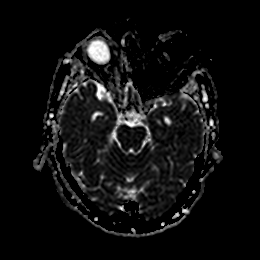
[im 32/48]
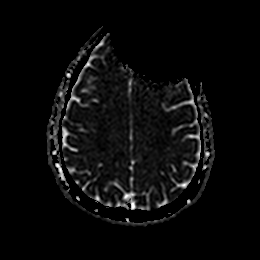
[im 48/48]
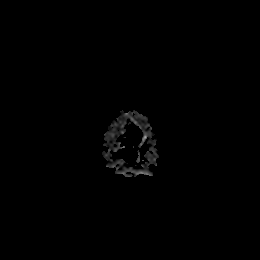

[Series 11: DWI · coronal · 4.0mm · 0.88mm/px · 6 of 66 slices shown (3 of 4)]
[im 1/66]
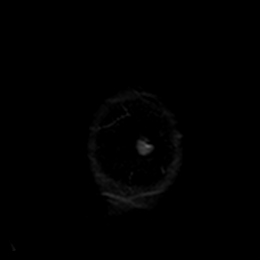
[im 14/66]
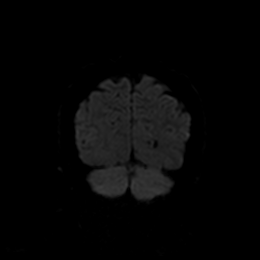
[im 27/66]
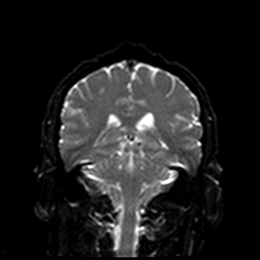
[im 40/66]
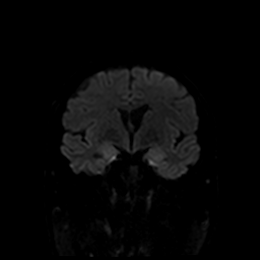
[im 53/66]
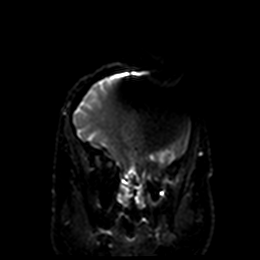
[im 66/66]
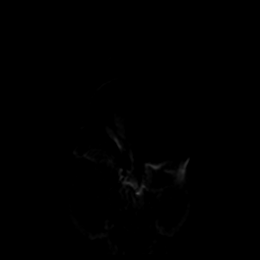

[Series 12: DWI · coronal · 4.0mm · 0.88mm/px · 3 of 33 slices shown (4 of 4)]
[im 1/33]
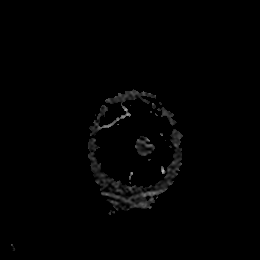
[im 17/33]
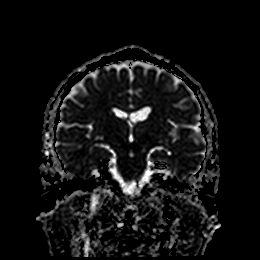
[im 33/33]
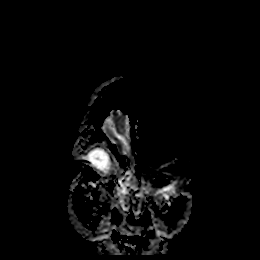

[Series 13: T1 · sagittal · 5.0mm · 0.75mm/px · 2 of 23 slices shown]
[im 1/23]
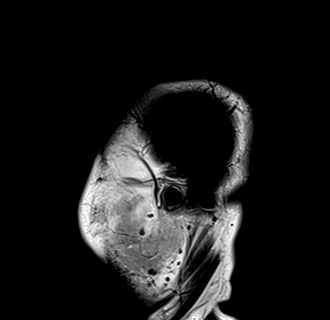
[im 23/23]
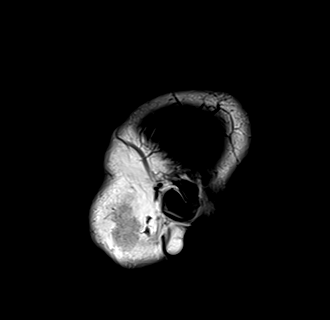

[Series 14: T2 · axial · 5.0mm · 0.72mm/px · z∈[-61,+79]mm · 2 of 25 slices shown (1 of 2)]
[im 1/25]
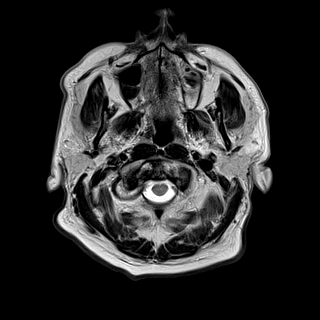
[im 25/25]
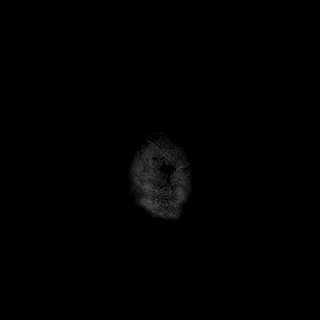

[Series 15: FLAIR · axial · 5.0mm · 0.45mm/px · z∈[-59,+81]mm · 2 of 25 slices shown]
[im 1/25]
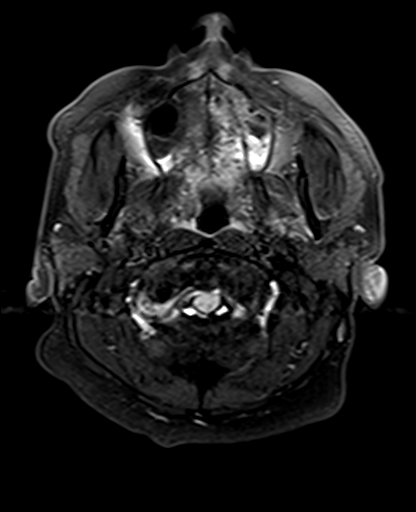
[im 25/25]
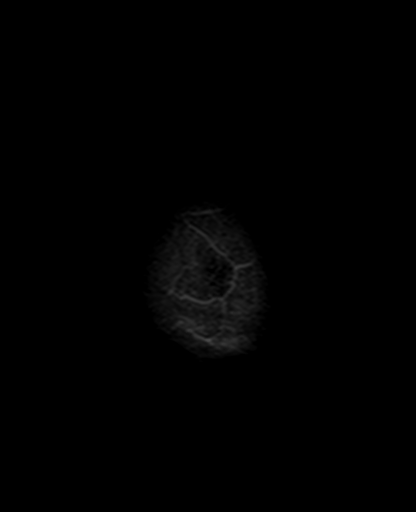

[Series 17: pha_images · axial · 3.0mm · 0.90mm/px · z∈[-75,+92]mm · 5 of 58 slices shown]
[im 1/58]
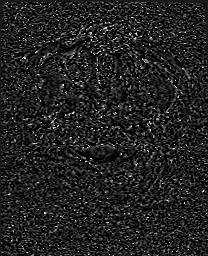
[im 15/58]
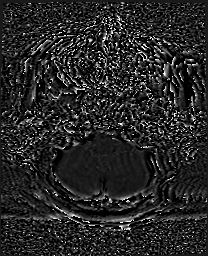
[im 29/58]
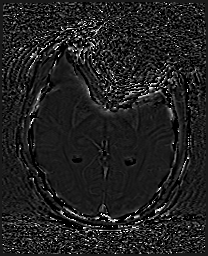
[im 43/58]
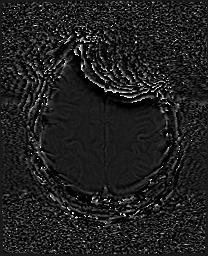
[im 58/58]
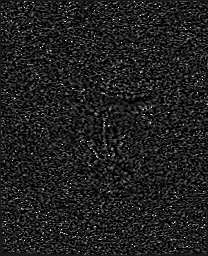

[Series 18: swi_images · axial · 3.0mm · 0.90mm/px · z∈[-75,+97]mm · 6 of 60 slices shown]
[im 1/60]
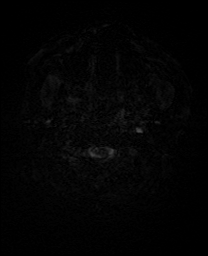
[im 12/60]
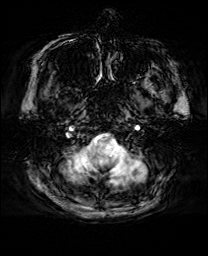
[im 24/60]
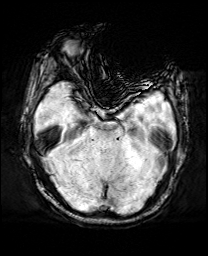
[im 36/60]
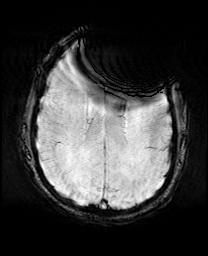
[im 48/60]
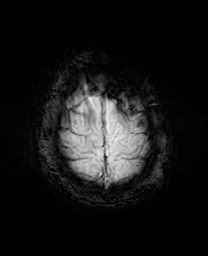
[im 60/60]
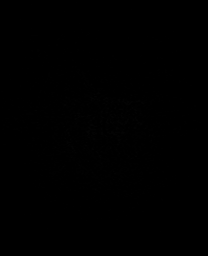

[Series 21: T2 · coronal · 5.0mm · 0.34mm/px · 3 of 29 slices shown (2 of 2)]
[im 1/29]
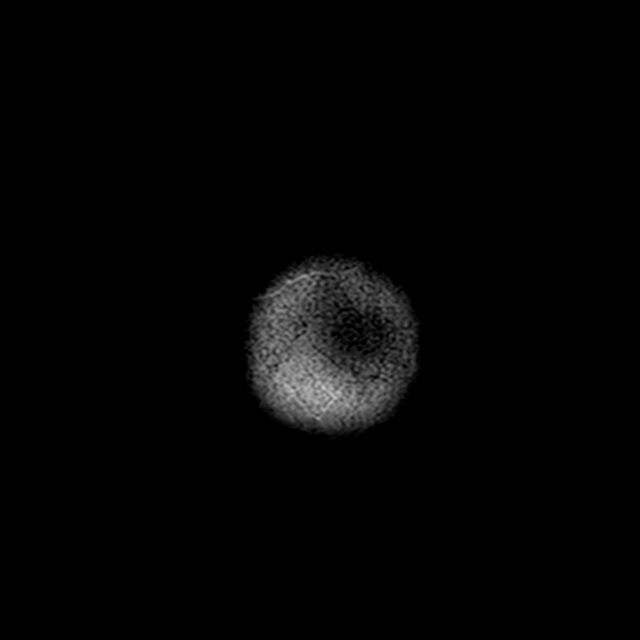
[im 15/29]
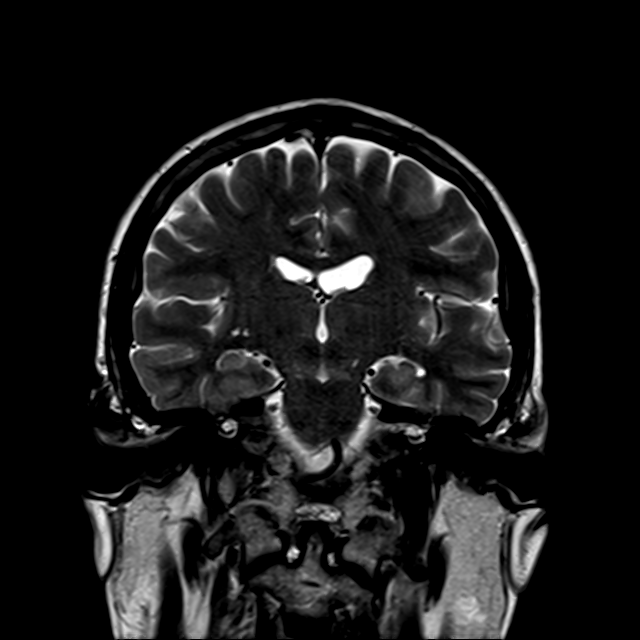
[im 29/29]
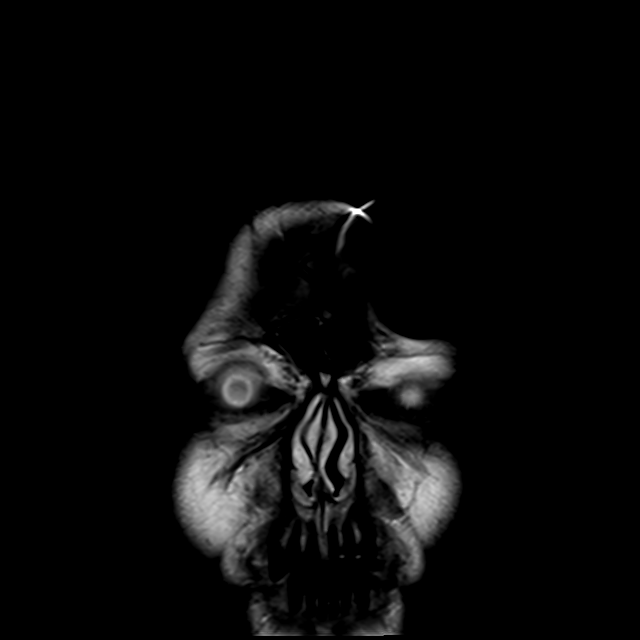

[42 of 48 positions shown; findings below may reference images not displayed]

FINDINGS: Brain: Examinations are with ectomy limited by susceptibility
artifact emanating from the left frontal scalp.

Cerebral volume within normal limits. No significant cerebral white
matter disease for age. No evidence for acute or subacute infarct.
Gray-white matter differentiation maintained. No encephalomalacia to
suggest chronic cortical infarction. No acute or chronic
intracranial hemorrhage.

No mass lesion or midline shift. No hydrocephalus or extra-axial
fluid collection. Pituitary gland suprasellar region normal. Midline
structures intact.

Vascular: Major intracranial vascular flow voids are maintained.

Skull and upper cervical spine: Craniocervical junction normal. Bone
marrow signal intensity normal. No acute scalp soft tissue
abnormality.

Sinuses/Orbits: Globes orbital soft tissues within normal limits.
Mild mucosal thickening noted within the ethmoidal air cells and
left maxillary sinus. No significant mastoid effusion.

Other: None.
IMPRESSION: Negative brain MRI for age. No acute intracranial abnormality
identified.
